# Patient Record
Sex: Female | Born: 1965 | Race: Black or African American | Hispanic: No | Marital: Married | Smoking: Never smoker
Health system: Southern US, Community
[De-identification: ages and names within clinical notes are randomized; demographics above are authoritative.]

## PROBLEM LIST (undated history)

## (undated) DIAGNOSIS — Z8049 Family history of malignant neoplasm of other genital organs: Secondary | ICD-10-CM

## (undated) DIAGNOSIS — Z5189 Encounter for other specified aftercare: Secondary | ICD-10-CM

## (undated) DIAGNOSIS — C801 Malignant (primary) neoplasm, unspecified: Secondary | ICD-10-CM

## (undated) DIAGNOSIS — Z803 Family history of malignant neoplasm of breast: Secondary | ICD-10-CM

## (undated) DIAGNOSIS — E079 Disorder of thyroid, unspecified: Secondary | ICD-10-CM

## (undated) HISTORY — PX: ABDOMINAL HYSTERECTOMY: SHX81

## (undated) HISTORY — PX: THYROIDECTOMY: SHX17

## (undated) HISTORY — DX: Malignant (primary) neoplasm, unspecified: C80.1

## (undated) HISTORY — DX: Family history of malignant neoplasm of breast: Z80.3

## (undated) HISTORY — DX: Family history of malignant neoplasm of other genital organs: Z80.49

## (undated) HISTORY — PX: MYOMECTOMY: SHX85

## (undated) HISTORY — PX: SHOULDER SURGERY: SHX246

## (undated) HISTORY — DX: Encounter for other specified aftercare: Z51.89

---

## 2000-04-25 DIAGNOSIS — C801 Malignant (primary) neoplasm, unspecified: Secondary | ICD-10-CM

## 2000-04-25 HISTORY — DX: Malignant (primary) neoplasm, unspecified: C80.1

## 2013-03-14 DIAGNOSIS — K625 Hemorrhage of anus and rectum: Secondary | ICD-10-CM | POA: Insufficient documentation

## 2013-03-28 DIAGNOSIS — Z803 Family history of malignant neoplasm of breast: Secondary | ICD-10-CM | POA: Insufficient documentation

## 2013-03-28 DIAGNOSIS — E039 Hypothyroidism, unspecified: Secondary | ICD-10-CM | POA: Insufficient documentation

## 2013-03-28 DIAGNOSIS — E669 Obesity, unspecified: Secondary | ICD-10-CM | POA: Insufficient documentation

## 2013-03-28 DIAGNOSIS — Z85850 Personal history of malignant neoplasm of thyroid: Secondary | ICD-10-CM | POA: Insufficient documentation

## 2013-03-28 DIAGNOSIS — Z833 Family history of diabetes mellitus: Secondary | ICD-10-CM | POA: Insufficient documentation

## 2013-03-28 DIAGNOSIS — Z90710 Acquired absence of both cervix and uterus: Secondary | ICD-10-CM | POA: Insufficient documentation

## 2013-04-10 DIAGNOSIS — N83209 Unspecified ovarian cyst, unspecified side: Secondary | ICD-10-CM | POA: Insufficient documentation

## 2013-04-10 DIAGNOSIS — R32 Unspecified urinary incontinence: Secondary | ICD-10-CM | POA: Insufficient documentation

## 2013-04-10 DIAGNOSIS — E890 Postprocedural hypothyroidism: Secondary | ICD-10-CM | POA: Insufficient documentation

## 2014-07-12 DIAGNOSIS — T783XXA Angioneurotic edema, initial encounter: Secondary | ICD-10-CM | POA: Insufficient documentation

## 2015-05-18 DIAGNOSIS — C73 Malignant neoplasm of thyroid gland: Secondary | ICD-10-CM | POA: Insufficient documentation

## 2016-08-04 DIAGNOSIS — N951 Menopausal and female climacteric states: Secondary | ICD-10-CM | POA: Insufficient documentation

## 2016-11-28 ENCOUNTER — Ambulatory Visit (HOSPITAL_COMMUNITY)
Admission: EM | Admit: 2016-11-28 | Discharge: 2016-11-28 | Disposition: A | Discharge status: Home / Self Care | Payer: PRIVATE HEALTH INSURANCE | Attending: Internal Medicine | Admitting: Internal Medicine

## 2016-11-28 ENCOUNTER — Encounter (HOSPITAL_COMMUNITY): Payer: Self-pay | Admitting: Nurse Practitioner

## 2016-11-28 DIAGNOSIS — L723 Sebaceous cyst: Secondary | ICD-10-CM | POA: Diagnosis not present

## 2016-11-28 HISTORY — DX: Disorder of thyroid, unspecified: E07.9

## 2016-11-28 MED ORDER — SULFAMETHOXAZOLE-TRIMETHOPRIM 800-160 MG PO TABS: 1.0000 | ORAL_TABLET | Freq: Two times a day (BID) | ORAL | 0 refills | Status: AC

## 2016-11-28 NOTE — Discharge Instructions (Addendum)
Wash gently with soap/water 1-2 times daily, apply antibiotic ointment and bandage.  Recheck at urgent care in about 48 hours and if any increasing redness/swelling/pain/drainage or new fever>100.5.  Prescription for trimethoprim/sulfa (antibiotic) sent to the pharmacy.  Ibuprofen or aleve otc as needed for pain.

## 2016-11-28 NOTE — ED Provider Notes (Signed)
[place]    CSN: [id number] Arrival date & time: 11/28/16  1734     History   Chief Complaint Chief Complaint  Patient presents with  . Breast Pain    HPI Anne Krause is a 51 y.o. female. She has a long-standing cyst on the inferior surface of her right breast, this was evaluated in the last year at [facility] and thought to be consistent with sebaceous cyst. She believes that long ago this required incision and drainage. In the last couple days, it has become red and very sore, swollen.  Not draining. No fever. 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  HPI  Past Medical History:  Diagnosis Date  . Thyroid disease     Past Surgical History:  Procedure Laterality Date  . ABDOMINAL HYSTERECTOMY    . MYOMECTOMY    . THYROIDECTOMY      Home Medications    Prior to Admission medications   Medication Sig Start Date End Date Taking? Authorizing Provider  levothyroxine (SYNTHROID, LEVOTHROID) 125 MCG tablet Take 125 mcg by mouth daily before breakfast.   Yes [provider]  sulfamethoxazole-trimethoprim (BACTRIM DS,SEPTRA DS) 800-160 MG tablet Take 1 tablet by mouth 2 (two) times daily. 11/28/16 12/05/16  Sherlene Shams, MD    Family History No family history on file.  Social History Social History  Substance Use Topics  . Smoking status: Never Smoker  . Smokeless tobacco: Never Used  . Alcohol use No     Allergies   Patient has no known allergies.   Review of Systems Review of Systems  All other systems reviewed and are negative.    Physical Exam Triage Vital Signs ED Triage Vitals  Enc Vitals Group     BP 11/28/16 1835 137/85     Pulse Rate 11/28/16 1835 80     Resp 11/28/16 1835 17     Temp 11/28/16 1835 97.8 F (36.6 C)     Temp Source 11/28/16 1835 Oral     SpO2 11/28/16 1835 100 %     Weight --      Height --      Pain Score 11/28/16 1839 8     Pain Loc --    Updated Vital Signs BP 137/85    Pulse 80   Temp 97.8 F (36.6 C) (Oral)   Resp 17   SpO2 100%   Physical Exam  Constitutional: She is oriented to person, place, and time. No distress.  HENT:  Head: Atraumatic.  Eyes:  Conjugate gaze observed, no eye redness/discharge  Neck: Neck supple.  Cardiovascular: Normal rate.   Pulmonary/Chest: No respiratory distress.  Abdominal: She exhibits no distension.  Musculoskeletal: Normal range of motion.  Neurological: She is alert and oriented to person, place, and time.  Skin: Skin is warm and dry.  Inferior surface of the right breast there is a 4 inch indurated/red area, with a 1.25 inch central cystic area, fluctuant. Not pointing.  Nursing note and vitals reviewed.    UC Treatments / Results   Procedures Procedures (including critical care time) Top of the lesion was cleaned with Hibiclens, and infiltrated with 2% lidocaine. A 1.5 cm incision was made and copious purulent material was expressed from the lesion. A dressing was applied per clinical staff.  Final Clinical Impressions(s) / UC Diagnoses   Final diagnoses:  Inflamed sebaceous cyst   Wash gently with soap/water 1-2 times daily, apply antibiotic ointment and bandage.  Recheck at urgent care in about 48 hours and if any increasing redness/swelling/pain/drainage or new fever>100.5.  Prescription for trimethoprim/sulfa (antibiotic) sent to the pharmacy.  Ibuprofen or aleve otc as needed for pain.  New Prescriptions Discharge Medication List as of 11/28/2016  7:30 PM    START taking these medications   Details  sulfamethoxazole-trimethoprim (BACTRIM DS,SEPTRA DS) 800-160 MG tablet Take 1 tablet by mouth 2 (two) times daily., Starting Mon 11/28/2016, Until Mon 12/05/2016, Normal         Controlled Substance Prescriptions [place] Controlled Substance Registry consulted? Not Applicable   Sherlene Shams, MD 12/02/16 [phone]

## 2016-11-28 NOTE — ED Triage Notes (Signed)
Pt presents with c/o painful cyst to right breast. She was first seen by a healthcare provider for this cyst this past December and had testing that found the cyst was benign. The cyst has remained since but this week has become red and painful.

## 2016-11-30 ENCOUNTER — Ambulatory Visit (HOSPITAL_COMMUNITY)
Admission: EM | Admit: 2016-11-30 | Discharge: 2016-11-30 | Disposition: A | Discharge status: Home / Self Care | Payer: PRIVATE HEALTH INSURANCE | Attending: Internal Medicine | Admitting: Internal Medicine

## 2016-11-30 DIAGNOSIS — N6001 Solitary cyst of right breast: Secondary | ICD-10-CM

## 2016-11-30 DIAGNOSIS — R110 Nausea: Secondary | ICD-10-CM

## 2016-11-30 MED ORDER — ONDANSETRON 8 MG PO TBDP: 8.0000 mg | ORAL_TABLET | Freq: Three times a day (TID) | ORAL | 0 refills | Status: DC | PRN

## 2016-11-30 NOTE — Discharge Instructions (Signed)
You may return to work tomorrow.  Finish the antibiotics and take the Zofran prn nausea.

## 2016-11-30 NOTE — ED Provider Notes (Signed)
[place]    CSN: [id number] Arrival date & time: 11/30/16  1706     History   Chief Complaint No chief complaint on file.   HPI Anne Krause is a 51 y.o. female.   This is a follow up visit for an incision and drainage of the right lateral, inferior breast done two days ago.  The drainage is now minimal and the tenderness has significantly diminished.  Patient has had some nausea with the antibiotics.      Past Medical History:  Diagnosis Date  . Thyroid disease     There are no active problems to display for this patient.   Past Surgical History:  Procedure Laterality Date  . ABDOMINAL HYSTERECTOMY    . MYOMECTOMY    . THYROIDECTOMY      OB History    No data available       Home Medications    Prior to Admission medications   Medication Sig Start Date End Date Taking? Authorizing Provider  levothyroxine (SYNTHROID, LEVOTHROID) 125 MCG tablet Take 125 mcg by mouth daily before breakfast.    [provider]  ondansetron (ZOFRAN-ODT) 8 MG disintegrating tablet Take 1 tablet (8 mg total) by mouth every 8 (eight) hours as needed for nausea. 11/30/16   Robyn Haber, MD  sulfamethoxazole-trimethoprim (BACTRIM DS,SEPTRA DS) 800-160 MG tablet Take 1 tablet by mouth 2 (two) times daily. 11/28/16 12/05/16  Sherlene Shams, MD    Family History No family history on file.  Social History Social History  Substance Use Topics  . Smoking status: Never Smoker  . Smokeless tobacco: Never Used  . Alcohol use No     Allergies   Patient has no known allergies.   Review of Systems Review of Systems  Constitutional: Negative for fever.  Gastrointestinal: Positive for nausea. Negative for vomiting.  Skin: Positive for wound.  All other systems reviewed and are negative.    Physical Exam Triage Vital Signs ED Triage Vitals  Enc Vitals Group     BP      Pulse      Resp      Temp      Temp src      SpO2      Weight      Height        Head Circumference      Peak Flow      Pain Score      Pain Loc      Pain Edu?      Excl. in [place]?    No data found.   Updated Vital Signs There were no vitals taken for this visit.    Physical Exam  Constitutional: She is oriented to person, place, and time. She appears well-developed and well-nourished.  HENT:  Right Ear: External ear normal.  Left Ear: External ear normal.  Mouth/Throat: Oropharynx is clear and moist.  Eyes: Pupils are equal, round, and reactive to light. Conjunctivae and EOM are normal.  Neck: Normal range of motion. Neck supple.  Pulmonary/Chest: Effort normal.  Musculoskeletal: Normal range of motion.  Neurological: She is alert and oriented to person, place, and time.  Skin:  Incision right breast shows minimal serosanguinous drainage and early keloid formation.  Nursing note and vitals reviewed.    UC Treatments / Results  Labs (all labs ordered are listed, but only abnormal results are displayed) Labs Reviewed - No data to display  EKG  EKG Interpretation None  Radiology No results found.  Procedures Procedures (including critical care time)  Medications Ordered in UC Medications - No data to display   Initial Impression / Assessment and Plan / UC Course  I have reviewed the triage vital signs and the nursing notes.  Pertinent labs & imaging results that were available during my care of the patient were reviewed by me and considered in my medical decision making (see chart for details).      Final Clinical Impressions(s) / UC Diagnoses   Final diagnoses:  Breast cyst, right  Nausea without vomiting    New Prescriptions New Prescriptions   ONDANSETRON (ZOFRAN-ODT) 8 MG DISINTEGRATING TABLET    Take 1 tablet (8 mg total) by mouth every 8 (eight) hours as needed for nausea.     Controlled Substance Prescriptions [place] Controlled Substance Registry consulted? Not Applicable   Robyn Haber, MD 11/30/16  [phone]

## 2016-11-30 NOTE — ED Notes (Signed)
See downtime documentation 

## 2017-03-17 ENCOUNTER — Telehealth: Payer: Self-pay | Admitting: Genetics

## 2017-03-17 NOTE — Telephone Encounter (Signed)
Patient called and moved appt from 11/27 to 1/7. Patient has new date/time

## 2017-03-21 ENCOUNTER — Encounter: Payer: PRIVATE HEALTH INSURANCE | Admitting: Genetics

## 2017-03-21 ENCOUNTER — Other Ambulatory Visit: Payer: PRIVATE HEALTH INSURANCE

## 2017-05-01 ENCOUNTER — Inpatient Hospital Stay: Payer: PRIVATE HEALTH INSURANCE | Attending: Genetic Counselor | Admitting: Genetics

## 2017-05-01 DIAGNOSIS — Z8049 Family history of malignant neoplasm of other genital organs: Secondary | ICD-10-CM | POA: Diagnosis not present

## 2017-05-01 DIAGNOSIS — Z803 Family history of malignant neoplasm of breast: Secondary | ICD-10-CM

## 2017-05-02 ENCOUNTER — Encounter: Payer: Self-pay | Admitting: Genetics

## 2017-05-02 DIAGNOSIS — Z8049 Family history of malignant neoplasm of other genital organs: Secondary | ICD-10-CM | POA: Insufficient documentation

## 2017-05-02 NOTE — Progress Notes (Signed)
REFERRING PROVIDER: Tyson Dense, MD [address] [place], [place] [address]  PRIMARY PROVIDER:  System, Pcp Not In  PRIMARY REASON FOR VISIT:  1. Family history of breast cancer   2. Family history of uterine cancer     HISTORY OF PRESENT ILLNESS:   Anne Krause, a 52 y.o. female, was seen for a [place] cancer genetics consultation at the request of Dr. Royston Sinner due to a personal and family history of cancer.  Anne Krause presents to clinic today to discuss the possibility of a hereditary predisposition to cancer, genetic testing, and to further clarify her future cancer risks, as well as potential cancer risks for family members.   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     HORMONAL RISK FACTORS:  Menarche was at age 20.5.  First live birth at age N/A.  Ovaries intact: 1 intact, the other removed.  Hysterectomy: yes.  Menopausal status: perimenopause.  HRT use: 0 years. Considering stariting it.  Colonoscopy: no; not examined. Mammogram within the last year: 2018- a cyst. Both breasts heterogeneously dense Number of breast biopsies: 0.  Past Medical History:  Diagnosis Krause  . Cancer [facility] 2002   thyroid  . Family history of breast cancer   . Family history of uterine cancer   . Thyroid disease      Social History   Socioeconomic History  . Marital status: Married    Spouse name: None  . Number of children: None  . Years of education: None  . Highest education level: None  Social Needs  . Financial resource strain: None  . Food insecurity - worry: None  . Food insecurity - inability: None  . Transportation needs - medical: None  . Transportation needs - non-medical: None  Occupational History  . None  Tobacco Use  . Smoking status: Never Smoker  . Smokeless tobacco: Never Used  Substance and Sexual Activity  . Alcohol use: No  . Drug use: No  . Sexual activity: None  Other Topics  Concern  . None  Social History Narrative  . None     FAMILY HISTORY:  We obtained a detailed, 4-generation family history.  Significant diagnoses are listed below: Family History  Problem Relation Age of Onset  . Breast cancer Mother 37       radiation, tamoxifen for 2 years  . Uterine cancer Mother 42  . Stroke Maternal Grandmother    Anne Krause has no children.  She has 2 brothers in their 87's with no history of cancer.  Both of her brothers have children, none with any history of cancer.  Anne Krause also has 3 paternal half-sisters and 1 paternal half-brother in their 17's and 1's with no history of cancer.   Anne Krause mother was diagnosed with breast cancer at 38 and had lumpectomy, radiation,and took tamoxifen for 2 years.  She later developed uterine cancer at age 55.  She is currently 65.  Anne Krause has 4 maternal aunts in their 21's and 57's with no history of cancer.  She has 3 maternal uncles deceased in their 4's/60's with no history of cancer, and a maternal uncle still living in his 73's with no history of cancer.  Anne Krause has several maternal cousins with no history of cancer.  Anne Krause maternal grandfather died of 'natural causes' at older age and her maternal grandmother died at 66 due to a stroke.  [name] father died at 25 with no history of cancer. Her father had no siblings.  Anne Krause maternal grandfather died in his 60's/60's with no known history of cancer, and her paternal grandmother died due to 'natural causes' in older age.      Anne Krause is unaware of previous family history of genetic testing for hereditary cancer risks. Patient's maternal ancestors are of [place] descent, and paternal ancestors are of [place] descent. There is no reported Ashkenazi Jewish ancestry. There is no known consanguinity.  GENETIC COUNSELING ASSESSMENT:  We, discussed and recommended the following at today's visit.   DISCUSSION: We reviewed the  characteristics, features and inheritance patterns of hereditary cancer syndromes. We also discussed genetic testing, including the appropriate family members to test, the process of testing, insurance coverage and turn-around-time for results. We discussed the implications of a negative, positive and/or variant of uncertain significant result. We offered Anne Krause pursue genetic testing for the Common Hereditary Cancer Panel + Thyroid Cancer panel.  APC, ATM, AXIN2, BARD1, BMPR1A, BRCA1, BRCA2, BRIP1, CDH1, CDK4, CDKN2A (p14ARF), CDKN2A (p16INK4a), CHEK2, CTNNA1, DICER1, EPCAM, GREM1, KIT, MEN1, MLH1, MSH2, MSH3, MSH6, MUTYH, NBN, NF1, NHTL1, PALB2, PDGFRA, PMS2, POLD1, POLE, PTEN, RAD50, RAD51C, RAD51D, SDHB, SDHC, SDHD, SMAD4, SMARCA4. STK11, TP53, TSC1, TSC2, VHL, SDHA, HOXB13   The [facility] Thyroid Cancer Panel analyzes the following genes: APC, CHEK2, DICER1,PRKAR1A, PTEN, RET, TP53, MEN1, SDHB, SDHD, and WRN.   We discussed that only 5-10% of cancers are associated with a Hereditary cancer predisposition syndrome.  One of the most common hereditary cancer syndromes that increases breast cancer risk is called Hereditary Breast and Ovarian Cancer (HBOC) syndrome.  This syndrome is caused by mutations in the BRCA1 and BRCA2 genes.  This syndrome increases an individual's lifetime risk to develop breast, ovarian, pancreatic, and other types of cancer.  There are also many other cancer predisposition syndromes caused by mutations in several other genes.  We briefly discussed that CHEK2 is a moderate risk breast cancer gene that may also be associated to thyroid cancer.    We discussed that if she is found to have a mutation in one of these genes, it may impact future medical management recommendations such as increased cancer screenings and consideration of risk reducing surgeries.  A positive result could also have implications for the patient's family members.  A Negative result would mean we were unable  to identify a hereditary component to her cancer, but does not rule out the possibility of a hereditary basis for her and her family's cancer.  There could be mutations that are undetectable by current technology, or in genes not yet tested or identified to increase cancer risk.    We discussed the potential to find a Variant of Uncertain Significance or VUS.  These are variants that have not yet been identified as pathogenic or benign, and it is unknown if this variant is associated with increased cancer risk or if this is a normal finding.  Most VUS's are reclassified to benign or likely benign.   It should not be used to make medical management decisions. With time, we suspect the lab will determine the significance of any VUS's identified if any.   We discussed that some people do not want to undergo genetic testing due to fear of genetic discrimination.  A federal law called the Genetic Information Non-Discrimination Act (GINA) of 2008 helps protect individuals against genetic discrimination based on their genetic test results.  It impacts both health  insurance and employment.  For health insurance, it protects against increased premiums, being kicked off insurance or being forced to take a test in order to be insured.  For employment it protects against hiring, firing and promoting decisions based on genetic test results.  Health status due to a cancer diagnosis is not protected under GINA.  This law does not protect life insurance, disability insurance, or other types of insurance.   We discussed with Anne Krause that the family history is not highly consistent with a familial hereditary cancer syndrome, and we feel she is at low risk to harbor a gene mutation associated with such a condition. Thus, we did not recommend any genetic testing, at this time, and recommended Anne Krause continue to follow the cancer screening guidelines given by her primary healthcare provider.  However, we did offer her the  opportunity to have genetic testing if she still chooses (she expressed it may be helpful for deciding to use HRT or not).  We discussed the patient pay price for testing is $250 through the lab [facility].   Based on the patient's personal and family history, the statistical model (Tyrer Cusik)   Was used to estimate her risk of developing breast cancer. This estimates her lifetime risk of developing breast cancer to be approximately 22.1%. This estimation does not take into account any genetic testing results.  The patient's lifetime breast cancer risk is a preliminary estimate based on available information using one of several models endorsed by the [place] (ACS). The ACS recommends consideration of breast MRI screening as an adjunct to mammography for patients at high risk (defined as 20% or greater lifetime risk). A more detailed breast cancer risk assessment can be considered, if clinically indicated.   Anne Krause has been determined to be at high risk for breast cancer.  Therefore, we recommend that annual screening with mammography and breast MRI begin at age 69, or 10 years prior to the age of breast cancer diagnosis in a relative (whichever is earlier).  We discussed that Anne Krause should discuss her individual situation with her referring physician and determine a breast cancer screening plan with which they are both comfortable.       PLAN: Anne Krause decided not to pursue genetic testing today.  She will review information and think about the option to test and will contact us if she is interested in pursuing genetic testing in the future.   We encouraged Ms. Delano to remain in contact with cancer genetics annually so that we can continuously update the family history and inform her of any changes in cancer genetics and testing that may be of benefit for her family. Ms. Earll questions were answered to her satisfaction today. Our contact information was provided should additional  questions or concerns arise.  Lastly, we encouraged Ms. Riche to remain in contact with cancer genetics annually so that we can continuously update the family history and inform her of any changes in cancer genetics and testing that may be of benefit for this family.   Ms.  Lovins questions were answered to her satisfaction today. Our contact information was provided should additional questions or concerns arise. Thank you for the referral and allowing [place] to share in the care of your patient.   Tana Felts, MS Genetic Counselor [email or web address] phone: [phone]  The patient was seen for a total of 30 minutes in face-to-face genetic counseling.

## 2017-05-15 ENCOUNTER — Encounter: Payer: Self-pay | Admitting: Genetics

## 2017-12-01 ENCOUNTER — Encounter: Payer: Self-pay | Admitting: Gastroenterology

## 2017-12-07 ENCOUNTER — Ambulatory Visit: Payer: Self-pay | Admitting: Nurse Practitioner

## 2017-12-07 VITALS — BP 122/70 | HR 70 | Temp 98.8°F | Resp 18 | Wt 208.0 lb

## 2017-12-07 DIAGNOSIS — J0140 Acute pansinusitis, unspecified: Secondary | ICD-10-CM

## 2017-12-07 MED ORDER — AMOXICILLIN-POT CLAVULANATE 875-125 MG PO TABS: 1.0000 | ORAL_TABLET | Freq: Two times a day (BID) | ORAL | 0 refills | Status: AC

## 2017-12-07 NOTE — Patient Instructions (Signed)
Sinusitis, Adult -Take Augmentin as prescribed.  Take with food and water. -Use saline nasal spray for congestion. -Ibuprofen or Tylenol for pain, fever or general discomfort. -Increase fluids. -Use humidifier or vaporizer while at home and during sleeping hours. -Follow up as needed.   Sinusitis is soreness and inflammation of your sinuses. Sinuses are hollow spaces in the bones around your face. Your sinuses are located:  Around your eyes.  In the middle of your forehead.  Behind your nose.  In your cheekbones.  Your sinuses and nasal passages are lined with a stringy fluid (mucus). Mucus normally drains out of your sinuses. When your nasal tissues become inflamed or swollen, the mucus can become trapped or blocked so air cannot flow through your sinuses. This allows bacteria, viruses, and funguses to grow, which leads to infection. Sinusitis can develop quickly and last for 7?10 days (acute) or for more than 12 weeks (chronic). Sinusitis often develops after a cold. What are the causes? This condition is caused by anything that creates swelling in the sinuses or stops mucus from draining, including:  Allergies.  Asthma.  Bacterial or viral infection.  Abnormally shaped bones between the nasal passages.  Nasal growths that contain mucus (nasal polyps).  Narrow sinus openings.  Pollutants, such as chemicals or irritants in the air.  A foreign object stuck in the nose.  A fungal infection. This is rare.  What increases the risk? The following factors may make you more likely to develop this condition:  Having allergies or asthma.  Having had a recent cold or respiratory tract infection.  Having structural deformities or blockages in your nose or sinuses.  Having a weak immune system.  Doing a lot of swimming or diving.  Overusing nasal sprays.  Smoking.  What are the signs or symptoms? The main symptoms of this condition are pain and a feeling of pressure  around the affected sinuses. Other symptoms include:  Upper toothache.  Earache.  Headache.  Bad breath.  Decreased sense of smell and taste.  A cough that may get worse at night.  Fatigue.  Fever.  Thick drainage from your nose. The drainage is often green and it may contain pus (purulent).  Stuffy nose or congestion.  Postnasal drip. This is when extra mucus collects in the throat or back of the nose.  Swelling and warmth over the affected sinuses.  Sore throat.  Sensitivity to light.  How is this diagnosed? This condition is diagnosed based on symptoms, a medical history, and a physical exam. To find out if your condition is acute or chronic, your health care provider may:  Look in your nose for signs of nasal polyps.  Tap over the affected sinus to check for signs of infection.  View the inside of your sinuses using an imaging device that has a light attached (endoscope).  If your health care provider suspects that you have chronic sinusitis, you may also:  Be tested for allergies.  Have a sample of mucus taken from your nose (nasal culture) and checked for bacteria.  Have a mucus sample examined to see if your sinusitis is related to an allergy.  If your sinusitis does not respond to treatment and it lasts longer than 8 weeks, you may have an MRI or CT scan to check your sinuses. These scans also help to determine how severe your infection is. In rare cases, a bone biopsy may be done to rule out more serious types of fungal sinus disease. How is  this treated? Treatment for sinusitis depends on the cause and whether your condition is chronic or acute. If a virus is causing your sinusitis, your symptoms will go away on their own within 10 days. You may be given medicines to relieve your symptoms, including:  Topical nasal decongestants. They shrink swollen nasal passages and let mucus drain from your sinuses.  Antihistamines. These drugs block inflammation  that is triggered by allergies. This can help to ease swelling in your nose and sinuses.  Topical nasal corticosteroids. These are nasal sprays that ease inflammation and swelling in your nose and sinuses.  Nasal saline washes. These rinses can help to get rid of thick mucus in your nose.  If your condition is caused by bacteria, you will be given an antibiotic medicine. If your condition is caused by a fungus, you will be given an antifungal medicine. Surgery may be needed to correct underlying conditions, such as narrow nasal passages. Surgery may also be needed to remove polyps. Follow these instructions at home: Medicines  Take, use, or apply over-the-counter and prescription medicines only as told by your health care provider. These may include nasal sprays.  If you were prescribed an antibiotic medicine, take it as told by your health care provider. Do not stop taking the antibiotic even if you start to feel better. Hydrate and Humidify  Drink enough water to keep your urine clear or pale yellow. Staying hydrated will help to thin your mucus.  Use a cool mist humidifier to keep the humidity level in your home above 50%.  Inhale steam for 10-15 minutes, 3-4 times a day or as told by your health care provider. You can do this in the bathroom while a hot shower is running.  Limit your exposure to cool or dry air. Rest  Rest as much as possible.  Sleep with your head raised (elevated).  Make sure to get enough sleep each night. General instructions  Apply a warm, moist washcloth to your face 3-4 times a day or as told by your health care provider. This will help with discomfort.  Wash your hands often with soap and water to reduce your exposure to viruses and other germs. If soap and water are not available, use hand sanitizer.  Do not smoke. Avoid being around people who are smoking (secondhand smoke).  Keep all follow-up visits as told by your health care provider. This is  important. Contact a health care provider if:  You have a fever.  Your symptoms get worse.  Your symptoms do not improve within 10 days. Get help right away if:  You have a severe headache.  You have persistent vomiting.  You have pain or swelling around your face or eyes.  You have vision problems.  You develop confusion.  Your neck is stiff.  You have trouble breathing. This information is not intended to replace advice given to you by your health care provider. Make sure you discuss any questions you have with your health care provider. Document Released: 04/11/2005 Document Revised: 12/06/2015 Document Reviewed: 02/04/2015 Elsevier Interactive Patient Education  [employer].

## 2017-12-07 NOTE — Progress Notes (Signed)
Subjective:  Anne Krause is a 52 y.o. female who presents for evaluation of possible sinusitis.  Symptoms include bilateral ear pressure/pain, chills, headache described as throbbing and aching, nasal congestion, post nasal drip, sinus pressure, sinus pain, sore throat and swollen glands.  Patient also states that she has been having some bloody mucus when she blows her nose.  Onset of symptoms was 7 days ago, and has been gradually worsening since that time.  Treatment to date:  Mucinex, Pseudophed and Robitussin.  High risk factors for influenza complications:  none.  The following portions of the patient's history were reviewed and updated as appropriate:  allergies, current medications and past medical history.  Constitutional: positive for anorexia, chills and fatigue, negative for malaise, sweats and weight loss Eyes: negative Ears, nose, mouth, throat, and face: positive for nasal congestion, sore throat and See HPI, negative for ear drainage and earaches Respiratory: positive for cough, negative for asthma, chronic bronchitis, pleurisy/chest pain, sputum, stridor and wheezing Cardiovascular: negative Gastrointestinal: negative Neurological: positive for headaches, negative for coordination problems, dizziness, paresthesia, speech problems, tremors, vertigo and weakness Allergic/Immunologic: negative Objective:  BP 122/70 (BP Location: Right Arm, Patient Position: Sitting, Cuff Size: Normal)   Pulse 70   Temp 98.8 F (37.1 C) (Oral)   Resp 18   Wt 208 lb (94.3 kg)   SpO2 98%  General appearance: alert, cooperative, fatigued and no distress Head: Normocephalic, without obvious abnormality, atraumatic Eyes: conjunctivae/corneas clear. PERRL, EOM's intact. Fundi benign. Ears: abnormal TM right ear - mucoid middle ear fluid Nose: no discharge, turbinates swollen, inflamed, moderate maxillary sinus tenderness bilateral, moderate frontal sinus tenderness bilateral Throat: lips, mucosa,  and tongue normal; teeth and gums normal Lungs: clear to auscultation bilaterally Heart: regular rate and rhythm, S1, S2 normal, no murmur, click, rub or gallop Abdomen: soft, non-tender; bowel sounds normal; no masses,  no organomegaly Pulses: 2+ and symmetric Skin: Skin color, texture, turgor normal. No rashes or lesions Lymph nodes: cervical and submandibular nodes normal Neurologic: Grossly normal    Assessment:  Acute Pansinusitis    Plan:  Exam findings, diagnosis etiology and medication use and indications reviewed with patient. Follow- Up and discharge instructions provided. No emergent/urgent issues found on exam.  The patient was instructed to use saline nasal spray to avoid any further breakdown of her nasal tissue.Patient verbalized understanding of information provided and agrees with plan of care (POC), all questions answered.   1. Acute pansinusitis, recurrence not specified  - amoxicillin-clavulanate (AUGMENTIN) 875-125 MG tablet; Take 1 tablet by mouth 2 (two) times daily for 10 days.  Dispense: 20 tablet; Refill: 0 -Take Augmentin as prescribed.  Take with food and water. -Use saline nasal spray for congestion. -Ibuprofen or Tylenol for pain, fever or general discomfort. -Increase fluids. -Use humidifier or vaporizer while at home and during sleeping hours. -Follow up as needed.

## 2018-01-26 ENCOUNTER — Ambulatory Visit (AMBULATORY_SURGERY_CENTER): Payer: Self-pay | Admitting: *Deleted

## 2018-01-26 ENCOUNTER — Encounter: Payer: Self-pay | Admitting: Gastroenterology

## 2018-01-26 VITALS — Ht 65.0 in | Wt 215.0 lb

## 2018-01-26 DIAGNOSIS — Z1211 Encounter for screening for malignant neoplasm of colon: Secondary | ICD-10-CM

## 2018-01-26 NOTE — Progress Notes (Signed)
Patient denies any allergies to eggs or soy. Patient denies any problems with anesthesia/sedation. Patient denies any oxygen use at home. Patient denies taking any diet/weight loss medications or blood thinners. EMMI education assisgned to patient on colonoscopy, this was explained and instructions given to patient. 

## 2018-02-09 ENCOUNTER — Ambulatory Visit (AMBULATORY_SURGERY_CENTER): Payer: BLUE CROSS/BLUE SHIELD | Admitting: Gastroenterology

## 2018-02-09 ENCOUNTER — Encounter: Payer: Self-pay | Admitting: Gastroenterology

## 2018-02-09 VITALS — BP 106/74 | HR 65 | Temp 96.8°F | Resp 23 | Ht 65.0 in | Wt 208.0 lb

## 2018-02-09 DIAGNOSIS — D122 Benign neoplasm of ascending colon: Secondary | ICD-10-CM

## 2018-02-09 DIAGNOSIS — Z1211 Encounter for screening for malignant neoplasm of colon: Secondary | ICD-10-CM

## 2018-02-09 MED ORDER — SODIUM CHLORIDE 0.9 % IV SOLN
500.0000 mL | Freq: Once | INTRAVENOUS | Status: DC
Start: 2018-02-09 — End: 2018-02-09

## 2018-02-09 NOTE — Op Note (Signed)
[place] Patient Name: Anne Krause Procedure Date: 02/09/2018 9:19 AM MRN: [id number] Endoscopist: Thornton Park MD, MD Age: 52 Referring MD:  Date of Birth: 08-15-1965 Gender: Female Account #: [id number] Procedure:                Colonoscopy Indications:              Screening for colorectal malignant neoplasm. There                            is no known family history of colon cancer or                            polyps. No baseline GI symptoms. Medicines:                See the Anesthesia note for documentation of the                            administered medications Procedure:                Pre-Anesthesia Assessment:                           - Prior to the procedure, a History and Physical                            was performed, and patient medications and                            allergies were reviewed. The patient's tolerance of                            previous anesthesia was also reviewed. The risks                            and benefits of the procedure and the sedation                            options and risks were discussed with the patient.                            All questions were answered, and informed consent                            was obtained. Prior Anticoagulants: The patient has                            taken no previous anticoagulant or antiplatelet                            agents. ASA Grade Assessment: II - A patient with                            mild systemic disease. After reviewing the risks  and benefits, the patient was deemed in                            satisfactory condition to undergo the procedure.                           After obtaining informed consent, the colonoscope                            was passed under direct vision. Throughout the                            procedure, the patient's blood pressure, pulse, and                            oxygen saturations were  monitored continuously. The                            Colonoscope was introduced through the anus and                            advanced to the the terminal ileum, with                            identification of the appendiceal orifice and IC                            valve. The colonoscopy was performed without                            difficulty. The patient tolerated the procedure                            well. The quality of the bowel preparation was good. Scope In: 9:20:04 AM Scope Out: 9:33:25 AM Scope Withdrawal Time: 0 hours 10 minutes 34 seconds  Total Procedure Duration: 0 hours 13 minutes 21 seconds  Findings:                 The perianal and digital rectal examinations were                            normal. [Pertinent Negatives].                           A 3 mm polyp was found in the proximal ascending                            colon. The polyp was sessile. The polyp was removed                            with a cold snare. Resection and retrieval were                            complete. Estimated blood loss was minimal.  A 2 mm polyp was found in the distal ascending                            colon. The polyp was sessile. The polyp was removed                            with a cold biopsy forceps. Resection and retrieval                            were complete. Estimated blood loss was minimal.                           The exam was otherwise without abnormality on                            direct and retroflexion views. Complications:            No immediate complications. Estimated blood loss:                            Minimal. Estimated Blood Loss:     Estimated blood loss was minimal. Impression:               - One 3 mm polyp in the proximal ascending colon,                            removed with a cold snare. Resected and retrieved.                           - One 2 mm polyp in the distal ascending colon,                             removed with a cold biopsy forceps. Resected and                            retrieved.                           - The examination was otherwise normal on direct                            and retroflexion views. Recommendation:           - Discharge patient to home.                           - Resume previous diet today.                           - Continue present medications.                           - Await pathology results.                           - Repeat colonoscopy in 5 years  for surveillance if                            at least one polyp is adenomatous. Thornton Park MD, MD 02/09/2018 9:38:55 AM This report has been signed electronically.

## 2018-02-09 NOTE — Progress Notes (Signed)
Report given to PACU, vss 

## 2018-02-09 NOTE — Progress Notes (Signed)
Recovery and d/c instructions given and done by e watkins Rn

## 2018-02-09 NOTE — Progress Notes (Signed)
Called to room to assist during endoscopic procedure.  Patient ID and intended procedure confirmed with present staff. Received instructions for my participation in the procedure from the performing physician.  

## 2018-02-09 NOTE — Progress Notes (Signed)
Pt's states no medical or surgical changes since previsit or office visit. 

## 2018-02-09 NOTE — Patient Instructions (Signed)
   Thank you for allowing [place] to care for you today!  Await pathology results by mail, will determine recommended repeat colonoscopy at that time.  Handout provided for polyps.  Resume previous diet and medications today.  Return to normal activities tomorrow.   YOU HAD AN ENDOSCOPIC PROCEDURE TODAY AT [facility]:   Refer to the procedure report that was given to you for any specific questions about what was found during the examination.  If the procedure report does not answer your questions, please call your gastroenterologist to clarify.  If you requested that your care partner not be given the details of your procedure findings, then the procedure report has been included in a sealed envelope for you to review at your convenience later.  YOU SHOULD EXPECT: Some feelings of bloating in the abdomen. Passage of more gas than usual.  Walking can help get rid of the air that was put into your GI tract during the procedure and reduce the bloating. If you had a lower endoscopy (such as a colonoscopy or flexible sigmoidoscopy) you may notice spotting of blood in your stool or on the toilet paper. If you underwent a bowel prep for your procedure, you may not have a normal bowel movement for a few days.  Please Note:  You might notice some irritation and congestion in your nose or some drainage.  This is from the oxygen used during your procedure.  There is no need for concern and it should clear up in a day or so.  SYMPTOMS TO REPORT IMMEDIATELY:   Following lower endoscopy (colonoscopy or flexible sigmoidoscopy):  Excessive amounts of blood in the stool  Significant tenderness or worsening of abdominal pains  Swelling of the abdomen that is new, acute  Fever of 100F or higher   For urgent or emergent issues, a gastroenterologist can be reached at any hour by calling [phone].   DIET:  We do recommend a small meal at first, but then you may proceed to your regular  diet.  Drink plenty of fluids but you should avoid alcoholic beverages for 24 hours.  ACTIVITY:  You should plan to take it easy for the rest of today and you should NOT DRIVE or use heavy machinery until tomorrow (because of the sedation medicines used during the test).    FOLLOW UP: Our staff will call the number listed on your records the next business day following your procedure to check on you and address any questions or concerns that you may have regarding the information given to you following your procedure. If we do not reach you, we will leave a message.  However, if you are feeling well and you are not experiencing any problems, there is no need to return our call.  We will assume that you have returned to your regular daily activities without incident.  If any biopsies were taken you will be contacted by phone or by letter within the next 1-3 weeks.  Please call us at [phone] if you have not heard about the biopsies in 3 weeks.    SIGNATURES/CONFIDENTIALITY: You and/or your care partner have signed paperwork which will be entered into your electronic medical record.  These signatures attest to the fact that that the information above on your After Visit Summary has been reviewed and is understood.  Full responsibility of the confidentiality of this discharge information lies with you and/or your care-partner.

## 2018-02-12 ENCOUNTER — Telehealth: Payer: Self-pay

## 2018-02-12 ENCOUNTER — Telehealth: Payer: Self-pay | Admitting: *Deleted

## 2018-02-12 NOTE — Telephone Encounter (Signed)
Second post procedure follow up call, left message 

## 2018-02-12 NOTE — Telephone Encounter (Signed)
Entered in error. SM 

## 2018-02-12 NOTE — Telephone Encounter (Signed)
No answer for post procedure call back. Left message and will attempt to call back later this afternoon. Sm 

## 2018-02-12 NOTE — Telephone Encounter (Signed)
Message left, follow up for procedure.

## 2018-02-13 ENCOUNTER — Encounter: Payer: Self-pay | Admitting: Gastroenterology

## 2018-04-13 ENCOUNTER — Other Ambulatory Visit: Payer: Self-pay | Admitting: Obstetrics and Gynecology

## 2018-04-13 DIAGNOSIS — N611 Abscess of the breast and nipple: Secondary | ICD-10-CM

## 2018-04-19 ENCOUNTER — Other Ambulatory Visit: Payer: Self-pay | Admitting: Obstetrics and Gynecology

## 2018-04-19 ENCOUNTER — Ambulatory Visit
Admission: RE | Admit: 2018-04-19 | Discharge: 2018-04-19 | Disposition: A | Discharge status: Home / Self Care | Payer: BLUE CROSS/BLUE SHIELD | Source: Ambulatory Visit | Attending: Obstetrics and Gynecology | Admitting: Obstetrics and Gynecology

## 2018-04-19 DIAGNOSIS — N611 Abscess of the breast and nipple: Secondary | ICD-10-CM

## 2018-04-26 ENCOUNTER — Ambulatory Visit
Admission: RE | Admit: 2018-04-26 | Discharge: 2018-04-26 | Disposition: A | Discharge status: Home / Self Care | Payer: No Typology Code available for payment source | Source: Ambulatory Visit | Attending: Obstetrics and Gynecology | Admitting: Obstetrics and Gynecology

## 2018-04-26 ENCOUNTER — Other Ambulatory Visit: Payer: Self-pay | Admitting: Obstetrics and Gynecology

## 2018-04-26 DIAGNOSIS — N611 Abscess of the breast and nipple: Secondary | ICD-10-CM

## 2018-05-03 ENCOUNTER — Other Ambulatory Visit: Payer: Self-pay | Admitting: Obstetrics and Gynecology

## 2018-05-03 ENCOUNTER — Ambulatory Visit
Admission: RE | Admit: 2018-05-03 | Discharge: 2018-05-03 | Disposition: A | Discharge status: Home / Self Care | Payer: No Typology Code available for payment source | Source: Ambulatory Visit | Attending: Obstetrics and Gynecology | Admitting: Obstetrics and Gynecology

## 2018-05-03 DIAGNOSIS — N611 Abscess of the breast and nipple: Secondary | ICD-10-CM

## 2018-06-05 ENCOUNTER — Other Ambulatory Visit: Payer: No Typology Code available for payment source

## 2018-06-07 ENCOUNTER — Other Ambulatory Visit: Payer: No Typology Code available for payment source

## 2018-06-07 ENCOUNTER — Ambulatory Visit
Admission: RE | Admit: 2018-06-07 | Discharge: 2018-06-07 | Disposition: A | Discharge status: Home / Self Care | Payer: PRIVATE HEALTH INSURANCE | Source: Ambulatory Visit | Attending: Obstetrics and Gynecology | Admitting: Obstetrics and Gynecology

## 2018-06-07 DIAGNOSIS — N611 Abscess of the breast and nipple: Secondary | ICD-10-CM

## 2018-06-26 ENCOUNTER — Emergency Department (HOSPITAL_COMMUNITY): Payer: PRIVATE HEALTH INSURANCE

## 2018-06-26 ENCOUNTER — Emergency Department (HOSPITAL_COMMUNITY)
Admission: EM | Admit: 2018-06-26 | Discharge: 2018-06-26 | Disposition: A | Discharge status: Home / Self Care | Payer: PRIVATE HEALTH INSURANCE | Attending: Emergency Medicine | Admitting: Emergency Medicine

## 2018-06-26 ENCOUNTER — Other Ambulatory Visit: Payer: Self-pay

## 2018-06-26 ENCOUNTER — Encounter (HOSPITAL_COMMUNITY): Payer: Self-pay

## 2018-06-26 DIAGNOSIS — R079 Chest pain, unspecified: Secondary | ICD-10-CM | POA: Diagnosis present

## 2018-06-26 DIAGNOSIS — Z85850 Personal history of malignant neoplasm of thyroid: Secondary | ICD-10-CM | POA: Diagnosis not present

## 2018-06-26 DIAGNOSIS — R0789 Other chest pain: Secondary | ICD-10-CM

## 2018-06-26 DIAGNOSIS — E039 Hypothyroidism, unspecified: Secondary | ICD-10-CM | POA: Diagnosis not present

## 2018-06-26 DIAGNOSIS — R072 Precordial pain: Secondary | ICD-10-CM

## 2018-06-26 DIAGNOSIS — M546 Pain in thoracic spine: Secondary | ICD-10-CM | POA: Insufficient documentation

## 2018-06-26 DIAGNOSIS — Z79899 Other long term (current) drug therapy: Secondary | ICD-10-CM | POA: Diagnosis not present

## 2018-06-26 DIAGNOSIS — M549 Dorsalgia, unspecified: Secondary | ICD-10-CM

## 2018-06-26 LAB — CBC
HCT: 41.1 % (ref 36.0–46.0)
Hemoglobin: 12.2 g/dL (ref 12.0–15.0)
MCH: 23.5 pg — ABNORMAL LOW (ref 26.0–34.0)
MCHC: 29.7 g/dL — ABNORMAL LOW (ref 30.0–36.0)
MCV: 79.2 fL — ABNORMAL LOW (ref 80.0–100.0)
PLATELETS: 262 10*3/uL (ref 150–400)
RBC: 5.19 MIL/uL — ABNORMAL HIGH (ref 3.87–5.11)
RDW: 14.9 % (ref 11.5–15.5)
WBC: 7.7 10*3/uL (ref 4.0–10.5)
nRBC: 0 % (ref 0.0–0.2)

## 2018-06-26 LAB — I-STAT TROPONIN, ED: TROPONIN I, POC: 0.02 ng/mL (ref 0.00–0.08)

## 2018-06-26 LAB — I-STAT BETA HCG BLOOD, ED (MC, WL, AP ONLY): I-stat hCG, quantitative: <5 m[IU]/mL (ref <5)

## 2018-06-26 LAB — BASIC METABOLIC PANEL
ANION GAP: 4 — AB (ref 5–15)
BUN: 9 mg/dL (ref 6–20)
CO2: 28 mmol/L (ref 22–32)
CREATININE: 0.77 mg/dL (ref 0.44–1.00)
Calcium: 9.2 mg/dL (ref 8.9–10.3)
Chloride: 107 mmol/L (ref 98–111)
GFR calc Af Amer: >60 mL/min (ref >60)
GFR calc non Af Amer: >60 mL/min (ref >60)
Glucose, Bld: 90 mg/dL (ref 70–99)
Potassium: 4.1 mmol/L (ref 3.5–5.1)
Sodium: 139 mmol/L (ref 135–145)

## 2018-06-26 MED ORDER — ACETAMINOPHEN 500 MG PO TABS
1000.0000 mg | ORAL_TABLET | Freq: Once | ORAL | Status: AC
  Administered 2018-06-26: 1000 mg via ORAL
  Filled 2018-06-26: qty 2

## 2018-06-26 MED ORDER — SODIUM CHLORIDE 0.9% FLUSH: 3.0000 mL | Freq: Once | INTRAVENOUS | Status: DC

## 2018-06-26 MED ORDER — IBUPROFEN 400 MG PO TABS
400.0000 mg | ORAL_TABLET | Freq: Once | ORAL | Status: AC
  Administered 2018-06-26: 400 mg via ORAL
  Filled 2018-06-26: qty 1

## 2018-06-26 MED ORDER — METHOCARBAMOL 750 MG PO TABS: 750.0000 mg | ORAL_TABLET | Freq: Three times a day (TID) | ORAL | 0 refills | Status: DC | PRN

## 2018-06-26 NOTE — ED Triage Notes (Signed)
Pt reports chest pain X2 days. States some shortness of breath and radiation to her back. Denies cardiac hx.

## 2018-06-26 NOTE — ED Notes (Signed)
Signature pad not working in room. Pt unable to sign for discharge. Pt given discharge instructions, prescription, follow up information. Pt verbalized understanding and given the opportunity to ask questions.

## 2018-06-26 NOTE — Discharge Instructions (Addendum)
It was our pleasure to provide your ER care today - we hope that you feel better.  Your xray, ecg, and heart tests/lab work looks good.   Take ibuprofen or aleve as need for symptom relief. You may also try robaxin (muscle relaxer) as need for muscle pain/spasm - no driving when taking.  Follow up with primary care doctor in the coming week for recheck.  Return to ER if worse, new symptoms, fevers, increased trouble breathing, persistent/recurrent chest pain, other concern.

## 2018-06-26 NOTE — ED Notes (Signed)
Pt reporting that her chest pain started in her shoulder but has moved to the left side of her chest over the last two days. Pt reports that it worsens anytime she moves her upper body.

## 2018-06-26 NOTE — ED Provider Notes (Signed)
[place] EMERGENCY DEPARTMENT Provider Note   CSN: [id number] Arrival date & time: 06/26/18  1320    History   Chief Complaint Chief Complaint  Patient presents with  . Chest Pain    HPI Anne Krause is a 53 y.o. female.     Patient c/o pain to left chest and left upper back/shoulder for the past 3-4 days. Symptoms occur at rest, constant, dull, moderate, non radiating. Pain is reproduced/worsens with change in position, sitting from lying position, lying from sitting, turning torso, palp chest and left trapezius/scapular area. Patient denies trauma/injury. No neck or radicular pain. No associated sob, nv or diaphoresis. No personal or fam hx cad. No pleuritic pain. No leg pain or swelling. Non smoker. No hx trauma, travel/immobility, or surgery. No hx dvt or pe. Denies heartburn/indigestion. No abd pain or nv. No fever or chills. No cough or uri c/o.   The history is provided by the patient.  Chest Pain  Associated symptoms: back pain   Associated symptoms: no abdominal pain, no cough, no fever, no headache, no numbness, no palpitations, no shortness of breath, no vomiting and no weakness     Past Medical History:  Diagnosis Date  . Blood transfusion without reported diagnosis   . Cancer [facility]) 2002   thyroid  . Family history of breast cancer   . Family history of uterine cancer   . Thyroid disease     Patient Active Problem List   Diagnosis Date Noted  . Family history of uterine cancer   . Symptomatic menopausal or female climacteric states 08/04/2016  . Thyroid cancer ([facility]) 05/18/2015  . Angio-edema 07/12/2014  . 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 Ovarian cyst 04/10/2013  . Urinary leakage 04/10/2013  . Family history of breast cancer in mother 03/28/2013  . Family history of diabetes mellitus 03/28/2013  . H/O hysterectomy for benign disease 03/28/2013  . History of thyroid cancer 03/28/2013  . Hypothyroidism 03/28/2013  . Obesity  03/28/2013  . Bright red rectal bleeding 03/14/2013    Past Surgical History:  Procedure Laterality Date  . ABDOMINAL HYSTERECTOMY    . MYOMECTOMY    . SHOULDER SURGERY    . THYROIDECTOMY       OB History   No obstetric history on file.      Home Medications    Prior to Admission medications   Medication Sig Start Date End Date Taking? Authorizing Provider  estradiol (VIVELLE-DOT) 0.025 MG/24HR  11/29/17   [provider]  levothyroxine (SYNTHROID, LEVOTHROID) 125 MCG tablet Take 125 mcg by mouth daily before breakfast.    [provider]    Family History Family History  Problem Relation Age of Onset  . Breast cancer Mother 24       radiation, tamoxifen for 2 years  . Uterine cancer Mother 21  . Stroke Maternal Grandmother   . Colon cancer Neg Hx   . Esophageal cancer Neg Hx   . Rectal cancer Neg Hx   . Stomach cancer Neg Hx   . Colon polyps Neg Hx     Social History Social History   Tobacco Use  . Smoking status: Never Smoker  . Smokeless tobacco: Never Used  Substance Use Topics  . Alcohol use: No  . Drug use: No     Allergies   Patient has no known allergies.   Review of Systems Review of Systems  Constitutional: Negative for fever.  HENT: Negative for sore throat.   Eyes: Negative  for redness.  Respiratory: Negative for cough and shortness of breath.   Cardiovascular: Positive for chest pain. Negative for palpitations and leg swelling.  Gastrointestinal: Negative for abdominal pain and vomiting.  Genitourinary: Negative for flank pain.  Musculoskeletal: Positive for back pain. Negative for neck pain.  Skin: Negative for rash.  Neurological: Negative for weakness, numbness and headaches.  Hematological: Does not bruise/bleed easily.  Psychiatric/Behavioral: Negative for confusion.     Physical Exam Updated Vital Signs BP 132/90   Pulse 66   Temp 97.7 F (36.5 C) (Oral)   Resp 11   SpO2 98%   Physical Exam Vitals  signs and nursing note reviewed.  Constitutional:      Appearance: Normal appearance. She is well-developed.  HENT:     Head: Atraumatic.     Nose: Nose normal.     Mouth/Throat:     Mouth: Mucous membranes are moist.  Eyes:     General: No scleral icterus.    Conjunctiva/sclera: Conjunctivae normal.     Pupils: Pupils are equal, round, and reactive to light.  Neck:     Musculoskeletal: Normal range of motion and neck supple. No neck rigidity or muscular tenderness.     Trachea: No tracheal deviation.  Cardiovascular:     Rate and Rhythm: Normal rate and regular rhythm.     Pulses: Normal pulses.     Heart sounds: Normal heart sounds. No murmur. No friction rub. No gallop.   Pulmonary:     Effort: Pulmonary effort is normal. No respiratory distress.     Breath sounds: Normal breath sounds.  Chest:     Chest wall: Tenderness present.  Abdominal:     General: Bowel sounds are normal. There is no distension.     Palpations: Abdomen is soft.     Tenderness: There is no abdominal tenderness. There is no guarding.  Genitourinary:    Comments: No cva tenderness.  Musculoskeletal:        General: No swelling.     Right lower leg: No edema.     Left lower leg: No edema.     Comments: C/T spine non tender, aligned. Left trapezius, and left upper back muscular tenderness, reproducing symptoms.   Skin:    General: Skin is warm and dry.     Findings: No rash.  Neurological:     Mental Status: She is alert.     Comments: Alert, speech normal. Steady gait.   Psychiatric:        Mood and Affect: Mood normal.      ED Treatments / Results  Labs (all labs ordered are listed, but only abnormal results are displayed) Results for orders placed or performed during the hospital encounter of 40/98/11  Basic metabolic panel  Result Value Ref Range   Sodium 139 135 - 145 mmol/L   Potassium 4.1 3.5 - 5.1 mmol/L   Chloride 107 98 - 111 mmol/L   CO2 28 22 - 32 mmol/L   Glucose, Bld 90 70 -  99 mg/dL   BUN 9 6 - 20 mg/dL   Creatinine, Ser 0.77 0.44 - 1.00 mg/dL   Calcium 9.2 8.9 - 10.3 mg/dL   GFR calc non Af Amer >60 >60 mL/min   GFR calc Af Amer >60 >60 mL/min   Anion gap 4 (L) 5 - 15  CBC  Result Value Ref Range   WBC 7.7 4.0 - 10.5 K/uL   RBC 5.19 (H) 3.87 - 5.11 MIL/uL   Hemoglobin  12.2 12.0 - 15.0 g/dL   HCT 41.1 36.0 - 46.0 %   MCV 79.2 (L) 80.0 - 100.0 fL   MCH 23.5 (L) 26.0 - 34.0 pg   MCHC 29.7 (L) 30.0 - 36.0 g/dL   RDW 14.9 11.5 - 15.5 %   Platelets 262 150 - 400 K/uL   nRBC 0.0 0.0 - 0.2 %  I-stat troponin, ED  Result Value Ref Range   Troponin i, poc 0.02 0.00 - 0.08 ng/mL   Comment 3          I-Stat beta hCG blood, ED  Result Value Ref Range   I-stat hCG, quantitative <5.0 <5 mIU/mL   Comment 3             EKG EKG Interpretation  Date/Time:  Tuesday June 26 2018 13:25:38 EST Ventricular Rate:  77 PR Interval:  136 QRS Duration: 78 QT Interval:  370 QTC Calculation: 418 R Axis:   84 Text Interpretation:  Normal sinus rhythm Normal ECG Confirmed by Lajean Saver [phone]) on 06/26/2018 1:45:16 PM Also confirmed by Lajean Saver [phone]), editor Philomena Doheny [phone])  on 06/26/2018 2:56:01 PM   Radiology Dg Chest 2 View  Result Date: 06/26/2018 CLINICAL DATA:  Severe left shoulder pain EXAM: CHEST - 2 VIEW COMPARISON:  None. FINDINGS: The heart size and mediastinal contours are within normal limits. Both lungs are clear. The visualized skeletal structures are unremarkable. IMPRESSION: No acute abnormality of the lungs.  No focal airspace opacity Electronically Signed   By: Eddie Candle M.D.   On: 06/26/2018 15:16    Procedures Procedures (including critical care time)  Medications Ordered in ED Medications  sodium chloride flush (NS) 0.9 % injection 3 mL (has no administration in time range)  acetaminophen (TYLENOL) tablet 1,000 mg (has no administration in time range)  ibuprofen (ADVIL,MOTRIN) tablet 400 mg (has no administration in time  range)     Initial Impression / Assessment and Plan / ED Course  I have reviewed the triage vital signs and the nursing notes.  Pertinent labs & imaging results that were available during my care of the patient were reviewed by me and considered in my medical decision making (see chart for details).  Labs. Cxr. Ecg.  Reviewed nursing notes and prior charts for additional history.   After symptoms present/constant x 3-4 days, trop is normal. Symptoms are reproduced with movement on stretcher, turning, sitting, lying back, palpation - symptoms appear most c/w msk pain, and not c/w acs.   No meds pta, pt drove self. Ibuprofen po, acetaminophen po.  Vitals normal, pt currently appears stable for d/c.   rec nsaid, will give rx robaxin. rec close outpt pcp f/u.  Return precautions provided.     Final Clinical Impressions(s) / ED Diagnoses   Final diagnoses:  None    ED Discharge Orders    None       Lajean Saver, MD 06/26/18 1526

## 2018-07-11 ENCOUNTER — Telehealth: Payer: Self-pay

## 2018-07-11 NOTE — Telephone Encounter (Signed)
Incoming call from Santiago Glad -Teaching laboratory technician from [facility] [employer].  Appt made for April 1st at 11:30 am. Arrive 20 min early. Valet info given.  She will attempt to reach pt to let her know about appt and if she can't reach her - then she will send out an appt letter.

## 2018-07-11 NOTE — Telephone Encounter (Signed)
Outgoing call to patient to schedule appt here (received a referral) - no answer, left VM.  Left message for Santiago Glad - referral coordinator also at [facility].

## 2018-07-24 ENCOUNTER — Telehealth: Payer: Self-pay | Admitting: *Deleted

## 2018-07-24 NOTE — Telephone Encounter (Signed)
Attempted to contact the patient to prescreen her for her appt tomorrow. Left the patient a message to call the office back.

## 2018-07-24 NOTE — Telephone Encounter (Signed)
Patient's husband returned call and answered questions regarding the appt tomorrow. Patient has not had any signs/symptoms, traveled or known exposure. Explained that she will be asked again tomorrow at the front desk; along with a temperature. Explained the no visitor policy. 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

## 2018-07-24 NOTE — Telephone Encounter (Signed)
Called and left the patient a message to call the office back today before 4pm regarding her appt for tomorrow. The patient needs to be prescreened.

## 2018-07-25 ENCOUNTER — Other Ambulatory Visit: Payer: Self-pay | Admitting: Obstetrics and Gynecology

## 2018-07-25 ENCOUNTER — Inpatient Hospital Stay: Payer: PRIVATE HEALTH INSURANCE | Attending: Gynecologic Oncology | Admitting: Gynecologic Oncology

## 2018-07-25 ENCOUNTER — Other Ambulatory Visit: Payer: Self-pay

## 2018-07-25 ENCOUNTER — Telehealth: Payer: Self-pay | Admitting: *Deleted

## 2018-07-25 ENCOUNTER — Encounter: Payer: Self-pay | Admitting: Gynecologic Oncology

## 2018-07-25 VITALS — BP 126/68 | HR 76 | Temp 98.4°F | Resp 20 | Ht 65.0 in | Wt 220.7 lb

## 2018-07-25 DIAGNOSIS — N83202 Unspecified ovarian cyst, left side: Secondary | ICD-10-CM | POA: Insufficient documentation

## 2018-07-25 DIAGNOSIS — E039 Hypothyroidism, unspecified: Secondary | ICD-10-CM | POA: Insufficient documentation

## 2018-07-25 DIAGNOSIS — Z791 Long term (current) use of non-steroidal anti-inflammatories (NSAID): Secondary | ICD-10-CM | POA: Insufficient documentation

## 2018-07-25 DIAGNOSIS — Z853 Personal history of malignant neoplasm of breast: Secondary | ICD-10-CM | POA: Diagnosis not present

## 2018-07-25 DIAGNOSIS — Z90710 Acquired absence of both cervix and uterus: Secondary | ICD-10-CM | POA: Insufficient documentation

## 2018-07-25 DIAGNOSIS — Z6837 Body mass index (BMI) 37.0-37.9, adult: Secondary | ICD-10-CM | POA: Diagnosis not present

## 2018-07-25 DIAGNOSIS — E669 Obesity, unspecified: Secondary | ICD-10-CM | POA: Diagnosis not present

## 2018-07-25 DIAGNOSIS — Z79899 Other long term (current) drug therapy: Secondary | ICD-10-CM | POA: Diagnosis not present

## 2018-07-25 DIAGNOSIS — Z803 Family history of malignant neoplasm of breast: Secondary | ICD-10-CM | POA: Insufficient documentation

## 2018-07-25 NOTE — Progress Notes (Signed)
Consult Note: Gyn-Onc  Consult was requested by Dr. Royston Sinner for the evaluation of Anne Krause 53 y.o. female  CC:  Chief Complaint  Patient presents with  . Cyst of left ovary    Assessment/Plan:  Ms. Anne Krause  is a 53 y.o.  year old with a persistent left ovarian cyst (with a solid component) and normal CA 125. I discussed with Anne Krause that the natural history of this lesion which has overall decreased in size and has been associated with normal tumor markers for several years suggest that it is benign.  It is completely reasonable to observe this cystic lesion.  1 year intervals for repeat surveillance ultrasounds seem reasonable.  If this continues to remain stable and unchanged in appearance over 2 to 3 years of follow-up, it is reasonable at that point in time to discontinue routine surveillance, and only reinstitute scans if it becomes newly symptomatic.  I also discussed with Anne Krause the possibility of offering her surgery to remove the cystic mass.  This is also reasonable as she has some degree of anxiety that is associated with the long-term follow-up of this lesion.  I reassured her that cancer does not develop within an already benign cystic lesion, but rather occurs de novo in the surrounding ovarian tissue.  However I feel that she is an acceptable surgical risk to proceed with left salpingo-oophorectomy if she elects for this.  I explained that removal of the left tube and ovary will result in her transitioning to an abrupt surgical menopause.  Is unclear if she is completed her transition into menopause naturally.  Her mother has a history of both breast and what sounds like uterine cancer and therefore it is possible the patient has increased risk for gynecologic cancers.  Therefore is not unreasonable to consider removing the ovary.  The patient will consider her options and how she desires to proceed.  She will contact our office when she is determine how she would like to  proceed either with surgery or with ongoing surveillance.  I briefly discussed the surgery with her should she decide to proceed.  We discussed that this will likely take place via minimally invasive procedure with laparoscopic surgery with robotic assistance.  She will likely need lysis of adhesions given multiple prior abdominal surgeries.  I explained surgical risks including  bleeding, infection, damage to internal organs (such as bladder,ureters, bowels), blood clot, reoperation and rehospitalization. I explained that her history of obesity and multiple prior abdominal surgeries places her at increased risk for these.  She will contact the office over the next few weeks when she is determine how she would like to proceed with either surveillance or surgery.   HPI: [name] is a 53 year old woman who was seen in consultation at the request of Dr. Royston Sinner for persistent left ovarian cystic lesion.  The patient reports having a laparoscopic hysterectomy performed in January 2012 at [place] for a fibroid uterus.  Prior to that time she had to laparotomy incisions for abdominal myomectomies.  At the time of her robotic hysterectomy and right salpingo-oophorectomy she was noted to have extensive adhesive disease.  Following that surgery she developed a symptomatic left ovarian cyst which in 2016 measured 7 cm.  It was monitored with serial ultrasounds and decreased in size until it became approximately 3 cm in February 2020.  However at that time a 14 mm solid component was also identified.  At that time Ca1 25 remained normal at 12.  Specifications of the ultrasound performed on June 26, 2018 include the identification of a surgically absent uterus and right tube and ovary.  The left ovary measured 4.37 x 2.51 x 3.53 cm.  It contained a 14 x 13 mm solid-appearing mass seen and a 2.7 x 1.7 cm and 1.6 x 1.4 cm simple appearing cyst.  No blood flow was seen within the mass of the cyst.  The patient is  asymptomatic from the cystic lesions denies pelvic pain.  Her past medical history is significant for obesity (BMI 37 kg/m), hypothyroidism, and history of an abscess of the right breast.  Her family history significant for her mother having history of malignant breast cancer, she also then developed what sounds like uterine cancer later in life.  Her breast cancer was in her 43s.  Current Meds:  Outpatient Encounter Medications as of 07/25/2018  Medication Sig  . Calcium Carb-Cholecalciferol (CALCIUM+D3 PO) Take 1 tablet by mouth daily.  Marland Kitchen estradiol (VIVELLE-DOT) 0.025 MG/24HR Place 1 patch onto the skin 2 (two) times a week.   . levothyroxine (SYNTHROID, LEVOTHROID) 125 MCG tablet Take 125 mcg by mouth daily before breakfast.  . meloxicam (MOBIC) 15 MG tablet meloxicam 15 mg tablet  Take 1 tablet every day by oral route.  . Multiple Vitamins-Calcium (ONE-A-DAY WOMENS FORMULA PO) Take 1 tablet by mouth daily.  . [DISCONTINUED] methocarbamol (ROBAXIN) 750 MG tablet Take 1 tablet (750 mg total) by mouth 3 (three) times daily as needed (muscle spasm/pain).   No facility-administered encounter medications on file as of 07/25/2018.     Allergy: No Known Allergies  Social Hx:   Social History   Socioeconomic History  . Marital status: Married    Spouse name: Not on file  . Number of children: Not on file  . Years of education: Not on file  . Highest education level: Not on file  Occupational History  . Not on file  Social Needs  . Financial resource strain: Not on file  . Food insecurity:    Worry: Not on file    Inability: Not on file  . Transportation needs:    Medical: Not on file    Non-medical: Not on file  Tobacco Use  . Smoking status: Never Smoker  . Smokeless tobacco: Never Used  Substance and Sexual Activity  . Alcohol use: No  . Drug use: No  . Sexual activity: Yes  Lifestyle  . Physical activity:    Days per week: Not on file    Minutes per session: Not on file   . Stress: Not on file  Relationships  . Social connections:    Talks on phone: Not on file    Gets together: Not on file    Attends religious service: Not on file    Active member of club or organization: Not on file    Attends meetings of clubs or organizations: Not on file    Relationship status: Not on file  . Intimate partner violence:    Fear of current or ex partner: Not on file    Emotionally abused: Not on file    Physically abused: Not on file    Forced sexual activity: Not on file  Other Topics Concern  . Not on file  Social History Narrative  . Not on file    Past Surgical Hx:  Past Surgical History:  Procedure Laterality Date  . ABDOMINAL HYSTERECTOMY    . MYOMECTOMY    . SHOULDER SURGERY    .  THYROIDECTOMY      Past Medical Hx:  Past Medical History:  Diagnosis Date  . Blood transfusion without reported diagnosis   . Cancer [facility]) 2002   thyroid  . Family history of breast cancer   . Family history of uterine cancer   . Thyroid disease     Past Gynecological History:  No abnormal paps G0 No LMP recorded. Patient has had a hysterectomy.  Family Hx:  Family History  Problem Relation Age of Onset  . Breast cancer Mother 53       radiation, tamoxifen for 2 years  . Uterine cancer Mother 71  . Stroke Maternal Grandmother   . Colon cancer Neg Hx   . Esophageal cancer Neg Hx   . Rectal cancer Neg Hx   . Stomach cancer Neg Hx   . Colon polyps Neg Hx     Review of Systems:  Constitutional  Feels well,    ENT Normal appearing ears and nares bilaterally Skin/Breast  No rash, sores, jaundice, itching, dryness Cardiovascular  No chest pain, shortness of breath, or edema  Pulmonary  No cough or wheeze.  Gastro Intestinal  No nausea, vomitting, or diarrhoea. No bright red blood per rectum, no abdominal pain, change in bowel movement, or constipation.  Genito Urinary  No frequency, urgency, dysuria, no pelvic pain, no bleeding Musculo Skeletal   No myalgia, arthralgia, joint swelling or pain  Neurologic  No weakness, numbness, change in gait,  Psychology  No depression, anxiety, insomnia.   Vitals:  Blood pressure 126/68, pulse 76, temperature 98.4 F (36.9 C), temperature source Oral, resp. rate 20, height 5\' 5"  (1.651 m), weight 220 lb 11.2 oz (100.1 kg), SpO2 100 %.  Physical Exam: WD in NAD Neck  Supple NROM, without any enlargements.  Lymph Node Survey No cervical supraclavicular or inguinal adenopathy Cardiovascular  Pulse normal rate, regularity and rhythm. S1 and S2 normal.  Lungs  Clear to auscultation bilateraly, without wheezes/crackles/rhonchi. Good air movement.  Skin  No rash/lesions/breakdown  Psychiatry  Alert and oriented to person, place, and time  Abdomen  Normoactive bowel sounds, abdomen soft, non-tender and obese without evidence of hernia.  Back No CVA tenderness Genito Urinary  Vulva/vagina: Normal external female genitalia.   No lesions. No discharge or bleeding.  Bladder/urethra:  No lesions or masses, well supported bladder  Vagina: normal  Cervix and uterus surgical absent  Adnexa: no palpable masses. Rectal  Good tone, no masses no cul de sac nodularity.  Extremities  No bilateral cyanosis, clubbing or edema.   Thereasa Solo, MD  07/25/2018, 6:22 PM

## 2018-07-25 NOTE — Telephone Encounter (Signed)
mistake

## 2018-07-25 NOTE — Patient Instructions (Signed)
Dr Denman George has a low suspicion that this mass is a cancer. She is offering either that you have follow-up of the mass/cyst in 1 year with Dr Royston Sinner to monitor it for growth, however, cancers do not arise in benign cysts.  The other option is to have the ovary removed. This can take place after June, 2020 if you would like. This will likely be performed robotically like your hysterectomy was performed.  If you would like to proceed with surgery, notify Dr Denman George at [phone].

## 2018-07-31 ENCOUNTER — Other Ambulatory Visit: Payer: Self-pay | Admitting: Obstetrics and Gynecology

## 2018-07-31 DIAGNOSIS — N611 Abscess of the breast and nipple: Secondary | ICD-10-CM

## 2019-08-30 IMAGING — US ULTRASOUND RIGHT BREAST LIMITED
1 series · 9 of 9 positions shown · non-contrast
Comparison: Previous exam(s).

CLINICAL DATA: 52-year-old female presents for follow-up right
breast abscess. The patient states this area continues to drain on
its own and slowly improved. She has completed her course of
antibiotics.

EXAM:
ULTRASOUND OF THE RIGHT BREAST

[Series 1: ultrasound right breast limited · 0.06mm/px · 9 of 9 slices shown]
[im 1/9]
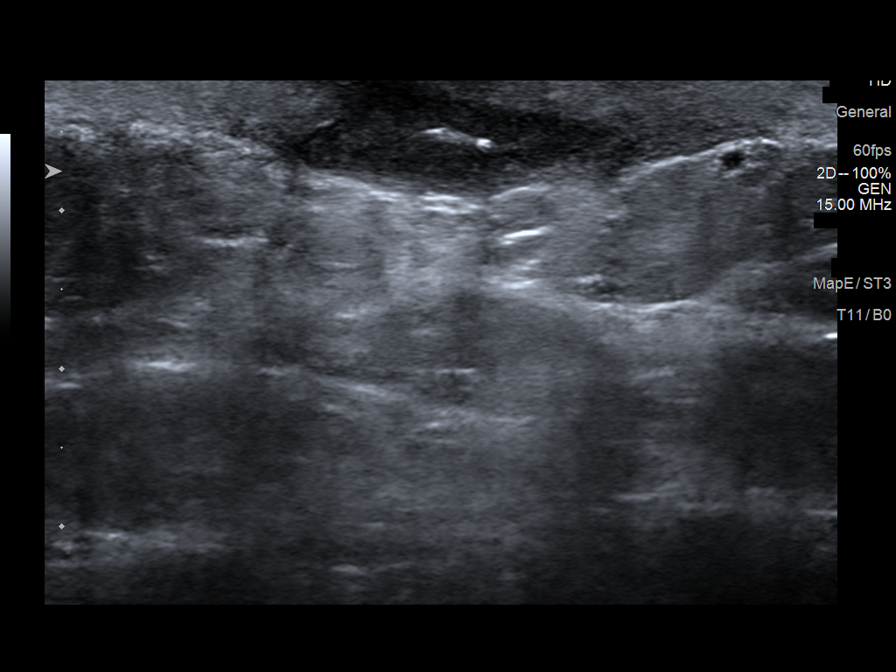
[im 2/9]
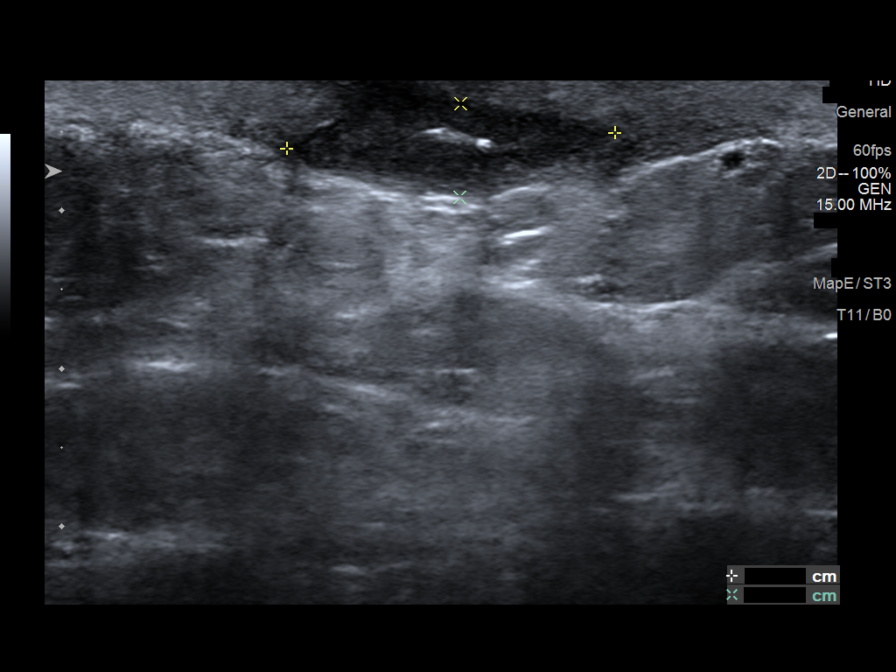
[im 3/9]
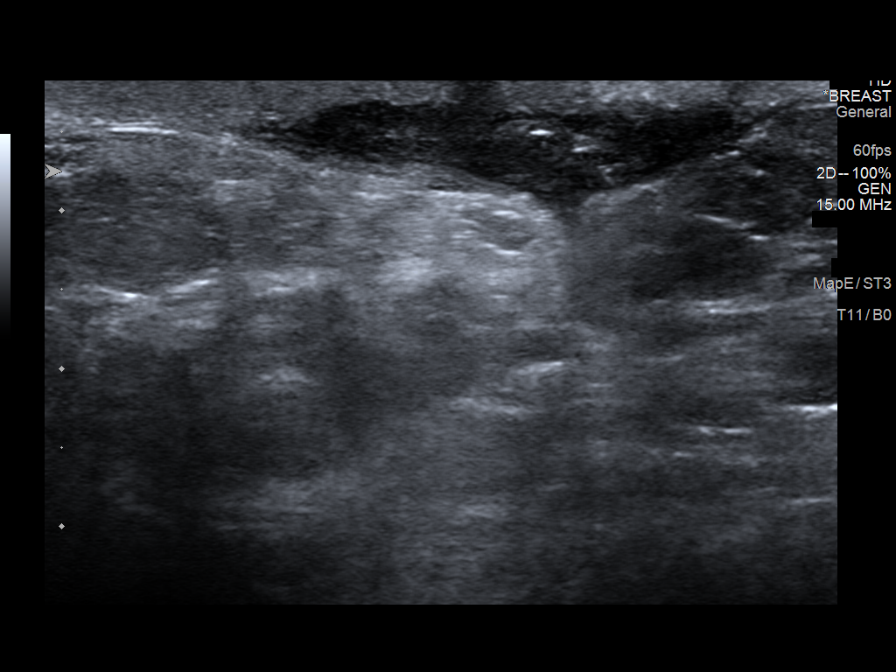
[im 4/9]
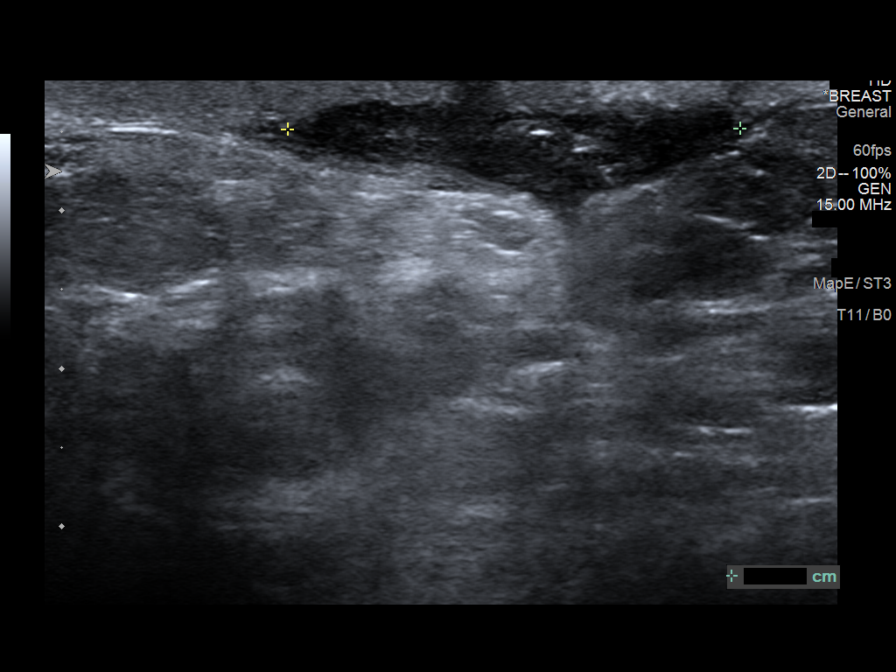
[im 5/9]
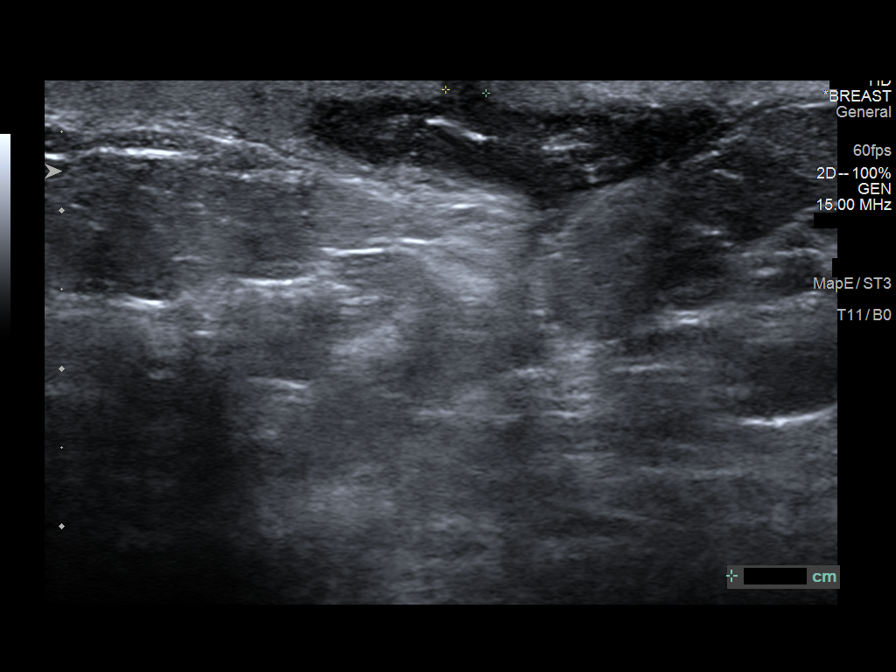
[im 6/9]
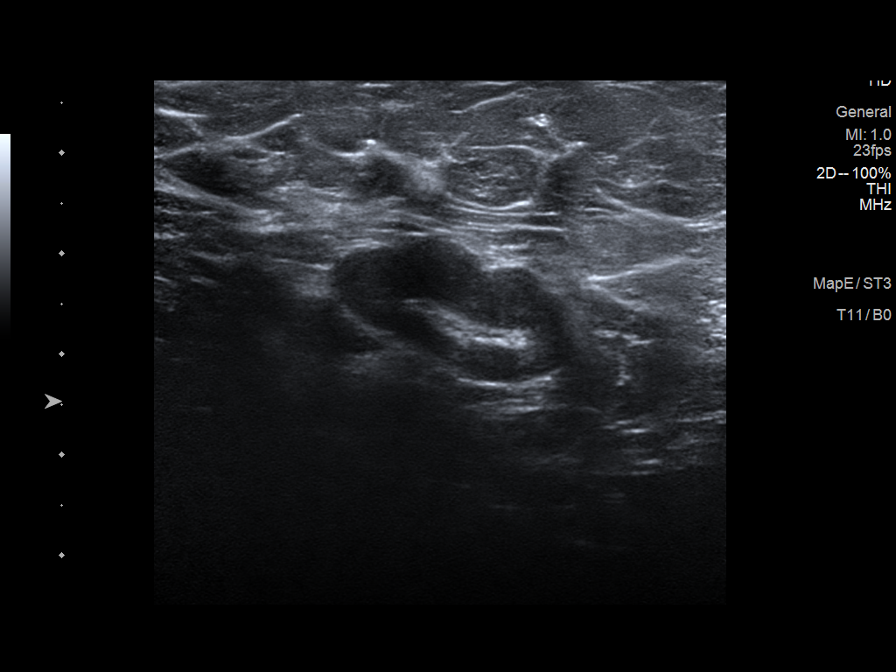
[im 7/9]
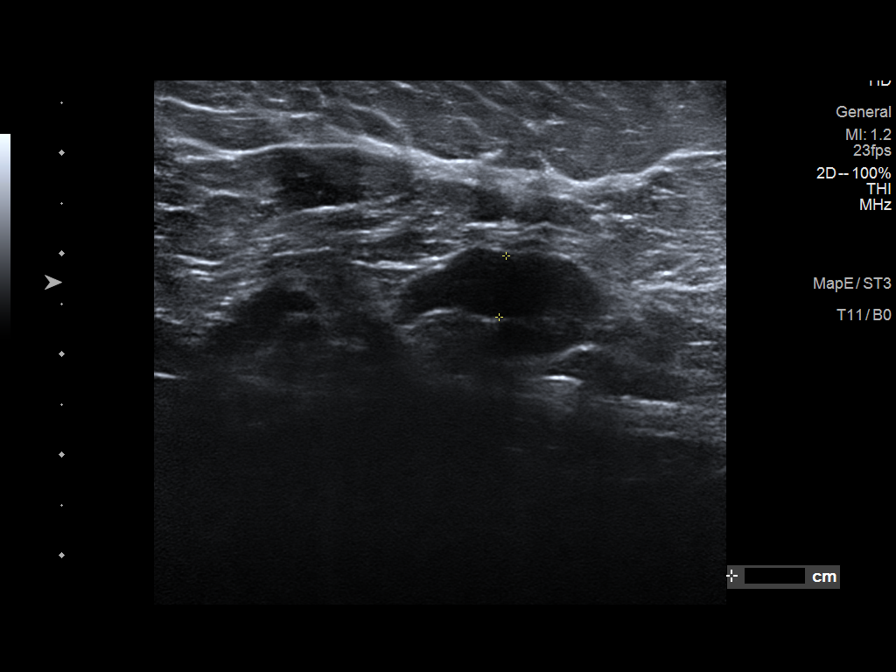
[im 8/9]
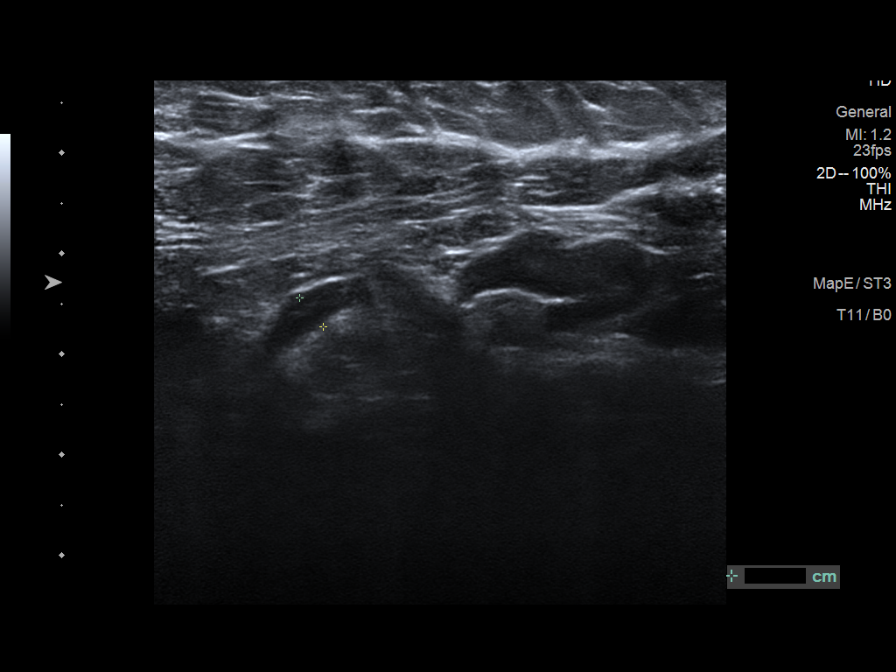
[im 9/9]
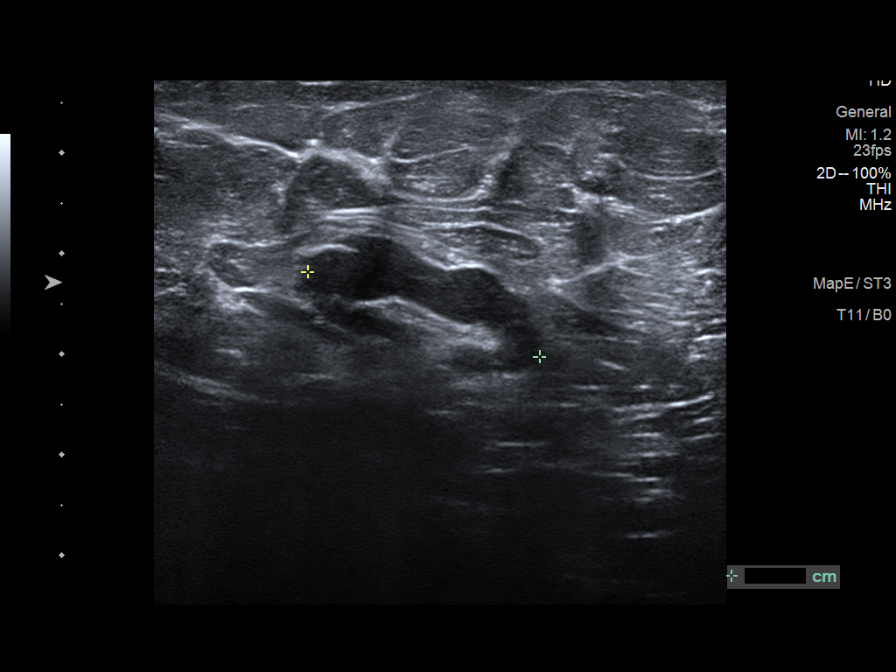

[9 of 9 positions shown; findings below may reference images not displayed]

FINDINGS: Physical exam of the right breast reveals mild firmness and erythema
of the skin of the right breast at 8 o'clock position near the
inframammary fold. The patient is nontender to palpation. No
drainage noted.

Targeted ultrasound of the right breast was performed. The
collection within the skin and extending to the skin surface at 8
o'clock 7 cm from nipple measures 2.1 x 0.6 x 2.9 cm, improved
initially measuring 2.8 x 1.1 x 3.6 cm. No new collections seen.

Targeted ultrasound of the right axilla was performed demonstrating
continued improvement in appearance of a lymph node with cortical
thickening in the right axilla.
IMPRESSION: 1. Resolving abscess within the skin of the right breast, possibly
an infected sebaceous cyst.

2.  Mild improvement in likely reactive axillary lymphadenopathy.

RECOMMENDATION:
The patient has been instructed to return in approximately 4-6 weeks
for repeat ultrasound of the right breast and right axilla to ensure
continued improvement/resolution of sonographic findings. If the
patient experiences any worsening of her symptoms in the interval,
she has been instructed to call the [REDACTED] and return sooner
for evaluation.

I have discussed the findings and recommendations with the patient.
Results were also provided in writing at the conclusion of the
visit. If applicable, a reminder letter will be sent to the patient
regarding the next appointment.

BI-RADS CATEGORY  3: Probably benign.

## 2019-10-23 IMAGING — DX DG CHEST 2V
2 series · 2 of 2 positions shown · non-contrast
Comparison: None.

CLINICAL DATA: Severe left shoulder pain

EXAM:
CHEST - 2 VIEW

[chest pa]
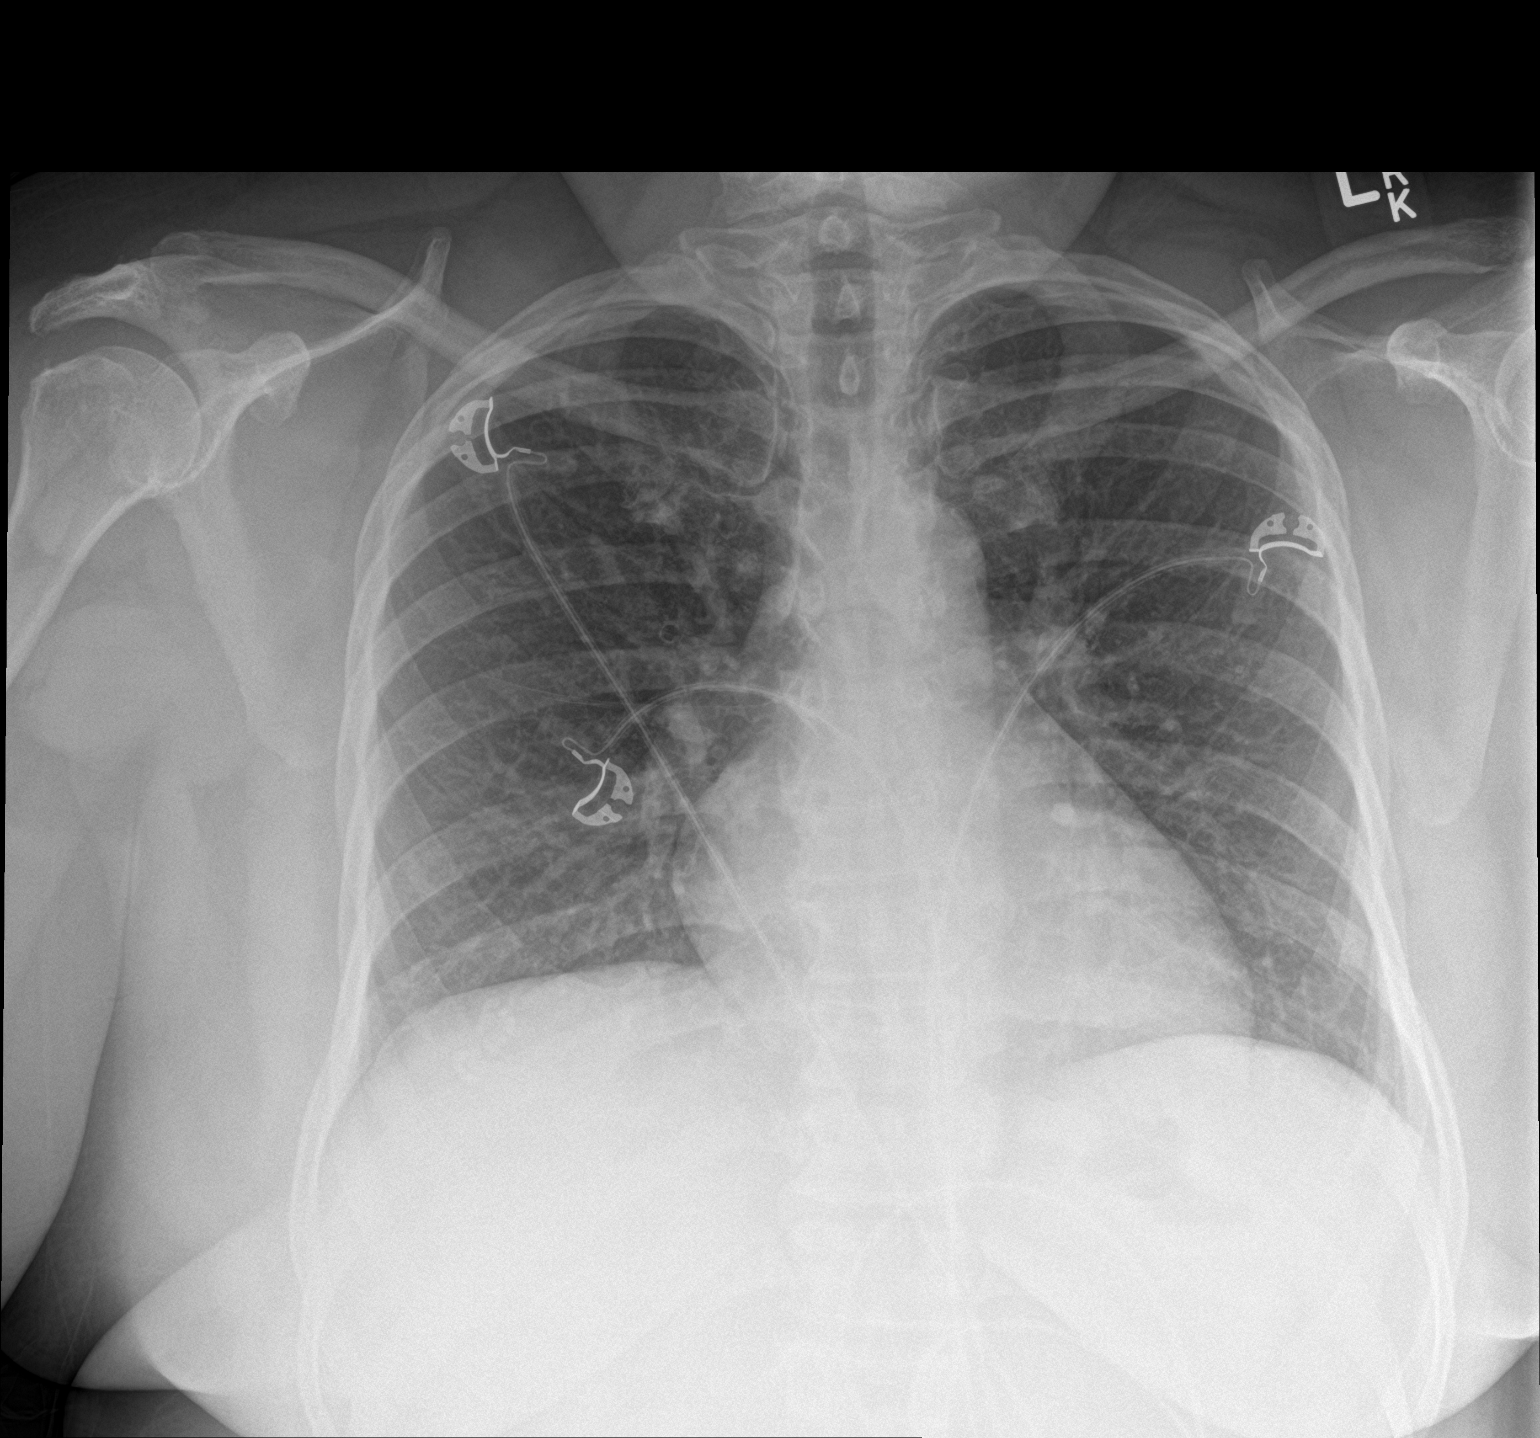

[chest lat]
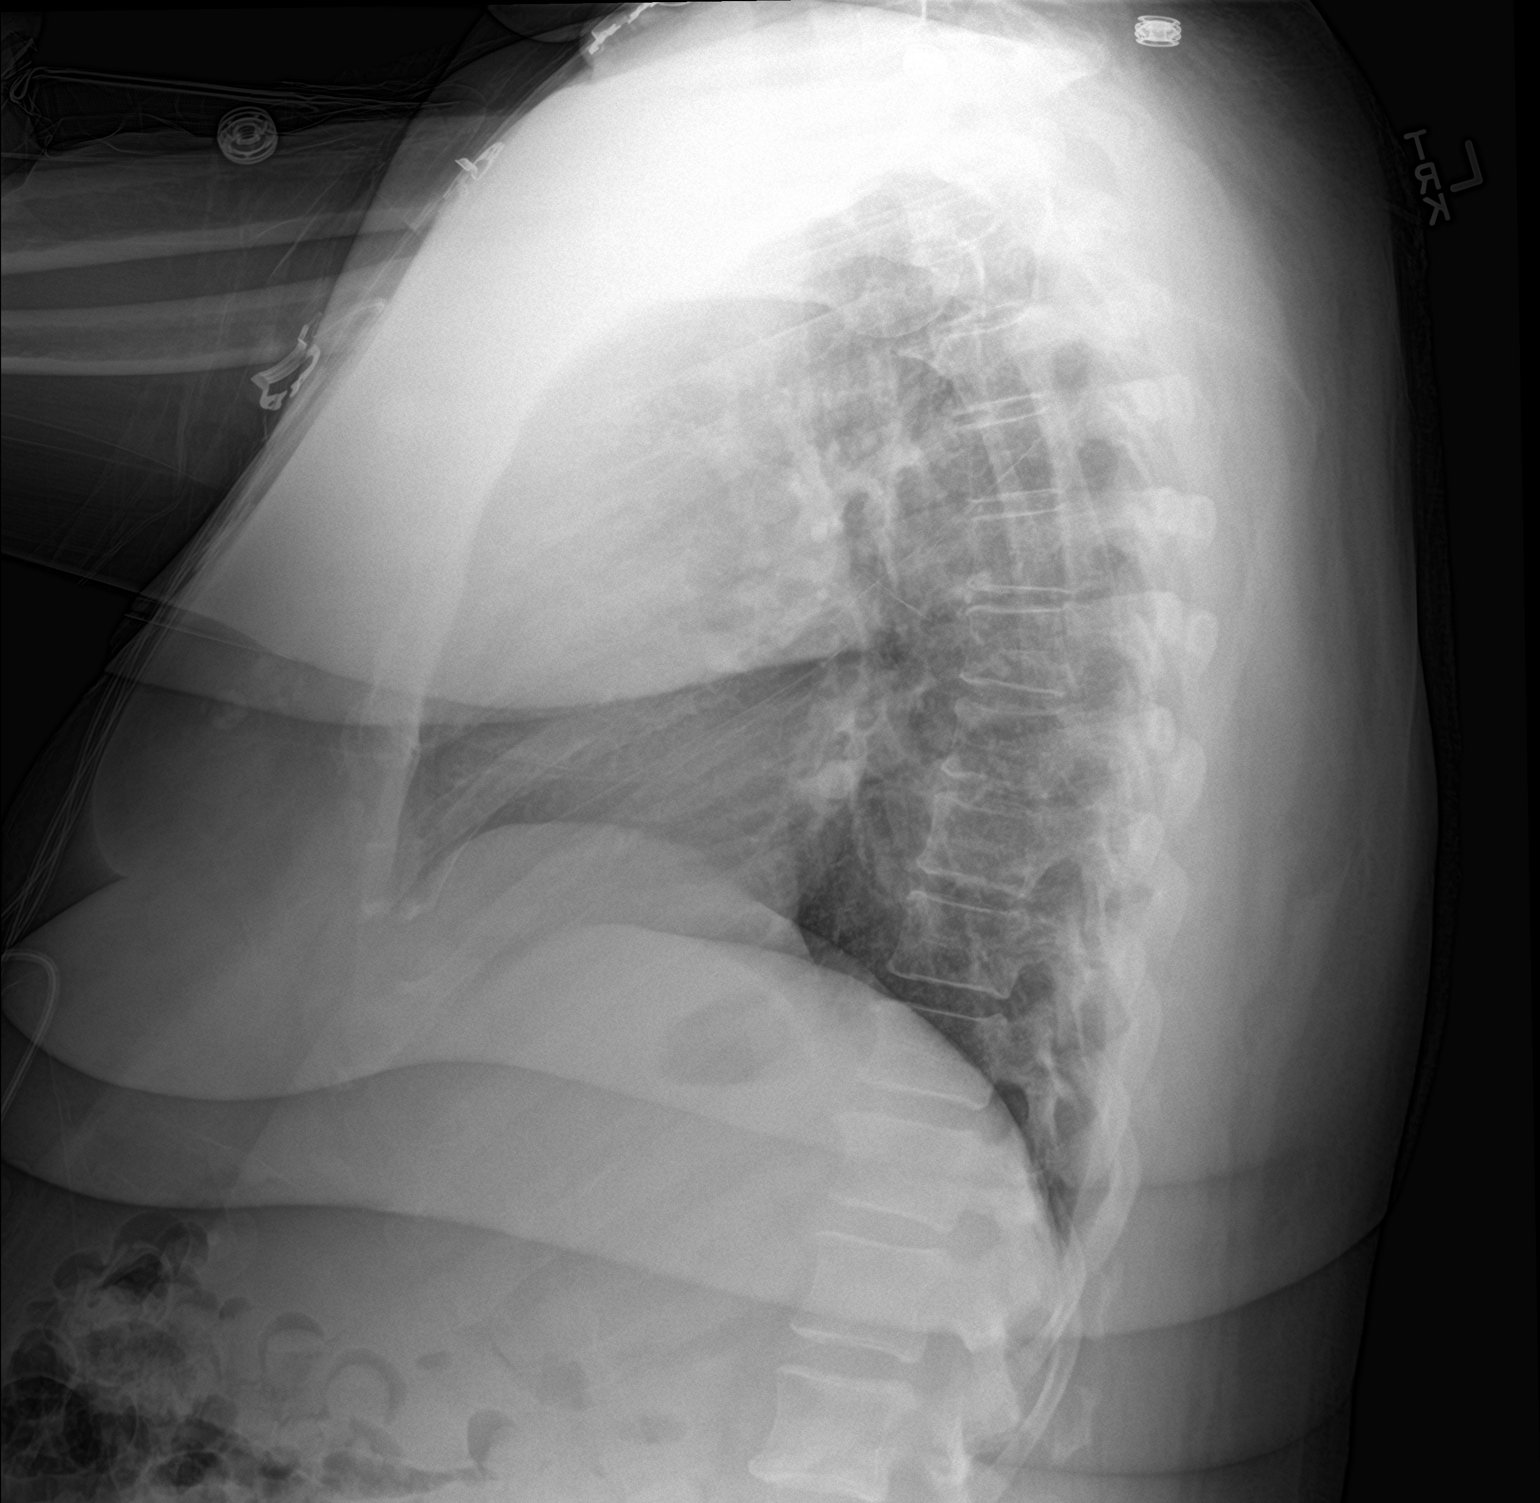

[2 of 2 positions shown; findings below may reference images not displayed]

FINDINGS: The heart size and mediastinal contours are within normal limits.
Both lungs are clear. The visualized skeletal structures are
unremarkable.
IMPRESSION: No acute abnormality of the lungs.  No focal airspace opacity

## 2021-08-02 ENCOUNTER — Ambulatory Visit: Payer: PRIVATE HEALTH INSURANCE | Admitting: Internal Medicine

## 2021-10-05 ENCOUNTER — Ambulatory Visit: Payer: PRIVATE HEALTH INSURANCE | Admitting: Internal Medicine

## 2021-11-26 ENCOUNTER — Ambulatory Visit: Payer: PRIVATE HEALTH INSURANCE | Attending: Internal Medicine | Admitting: Internal Medicine

## 2021-11-26 ENCOUNTER — Encounter: Payer: Self-pay | Admitting: Internal Medicine

## 2021-11-26 VITALS — BP 110/65 | HR 82 | Temp 98.1°F | Ht 65.0 in | Wt 222.4 lb

## 2021-11-26 DIAGNOSIS — E669 Obesity, unspecified: Secondary | ICD-10-CM

## 2021-11-26 DIAGNOSIS — E890 Postprocedural hypothyroidism: Secondary | ICD-10-CM

## 2021-11-26 DIAGNOSIS — Z7689 Persons encountering health services in other specified circumstances: Secondary | ICD-10-CM

## 2021-11-26 DIAGNOSIS — L720 Epidermal cyst: Secondary | ICD-10-CM | POA: Diagnosis not present

## 2021-11-26 MED ORDER — PHENTERMINE HCL 30 MG PO CAPS: 30.0000 mg | ORAL_CAPSULE | ORAL | 1 refills | Status: DC

## 2021-11-26 NOTE — Progress Notes (Signed)
Pt wants to discuss weight loss management.

## 2021-11-26 NOTE — Patient Instructions (Signed)
I have sent the prescription to your pharmacy for the phentermine.  Try to check your blood pressure at least once a week for the first few weeks of being on the medication.  Please let me know if you develop any palpitations.  I recommend using some antibiotic ointment/cream on the area of your right middle finger.  I would also do warm soaks about twice a week.  Healthy Eating Following a healthy eating pattern may help you to achieve and maintain a healthy body weight, reduce the risk of chronic disease, and live a long and productive life. It is important to follow a healthy eating pattern at an appropriate calorie level for your body. Your nutritional needs should be met primarily through food by choosing a variety of nutrient-rich foods. What are tips for following this plan? Reading food labels Read labels and choose the following: Reduced or low sodium. Juices with 100% fruit juice. Foods with low saturated fats and high polyunsaturated and monounsaturated fats. Foods with whole grains, such as whole wheat, cracked wheat, brown rice, and wild rice. Whole grains that are fortified with folic acid. This is recommended for women who are pregnant or who want to become pregnant. Read labels and avoid the following: Foods with a lot of added sugars. These include foods that contain brown sugar, corn sweetener, corn syrup, dextrose, fructose, glucose, high-fructose corn syrup, honey, invert sugar, lactose, malt syrup, maltose, molasses, raw sugar, sucrose, trehalose, or turbinado sugar. Do not eat more than the following amounts of added sugar per day: 6 teaspoons (25 g) for women. 9 teaspoons (38 g) for men. Foods that contain processed or refined starches and grains. Refined grain products, such as white flour, degermed cornmeal, white bread, and white rice. Shopping Choose nutrient-rich snacks, such as vegetables, whole fruits, and nuts. Avoid high-calorie and high-sugar snacks, such as  potato chips, fruit snacks, and candy. Use oil-based dressings and spreads on foods instead of solid fats such as butter, stick margarine, or cream cheese. Limit pre-made sauces, mixes, and "instant" products such as flavored rice, instant noodles, and ready-made pasta. Try more plant-protein sources, such as tofu, tempeh, black beans, edamame, lentils, nuts, and seeds. Explore eating plans such as the Mediterranean diet or vegetarian diet. Cooking Use oil to saut or stir-fry foods instead of solid fats such as butter, stick margarine, or lard. Try baking, boiling, grilling, or broiling instead of frying. Remove the fatty part of meats before cooking. Steam vegetables in water or broth. Meal planning  At meals, imagine dividing your plate into fourths: One-half of your plate is fruits and vegetables. One-fourth of your plate is whole grains. One-fourth of your plate is protein, especially lean meats, poultry, eggs, tofu, beans, or nuts. Include low-fat dairy as part of your daily diet. Lifestyle Choose healthy options in all settings, including home, work, school, restaurants, or stores. Prepare your food safely: Wash your hands after handling raw meats. Keep food preparation surfaces clean by regularly washing with hot, soapy water. Keep raw meats separate from ready-to-eat foods, such as fruits and vegetables. Cook seafood, meat, poultry, and eggs to the recommended internal temperature. Store foods at safe temperatures. In general: Keep cold foods at 1F (4.4C) or below. Keep hot foods at 11F (60C) or above. Keep your freezer at [facility] (-17.8C) or below. Foods are no longer safe to eat when they have been between the temperatures of 40-11F (4.4-60C) for more than 2 hours. What foods should I eat? Fruits Aim to eat  2 cup-equivalents of fresh, canned (in natural juice), or frozen fruits each day. Examples of 1 cup-equivalent of fruit include 1 small apple, 8 large  strawberries, 1 cup canned fruit,  cup dried fruit, or 1 cup 100% juice. Vegetables Aim to eat 2-3 cup-equivalents of fresh and frozen vegetables each day, including different varieties and colors. Examples of 1 cup-equivalent of vegetables include 2 medium carrots, 2 cups raw, leafy greens, 1 cup chopped vegetable (raw or cooked), or 1 medium baked potato. Grains Aim to eat 6 ounce-equivalents of whole grains each day. Examples of 1 ounce-equivalent of grains include 1 slice of bread, 1 cup ready-to-eat cereal, 3 cups popcorn, or  cup cooked rice, pasta, or cereal. Meats and other proteins Aim to eat 5-6 ounce-equivalents of protein each day. Examples of 1 ounce-equivalent of protein include 1 egg, 1/2 cup nuts or seeds, or 1 tablespoon (16 g) peanut butter. A cut of meat or fish that is the size of a deck of cards is about 3-4 ounce-equivalents. Of the protein you eat each week, try to have at least 8 ounces come from seafood. This includes salmon, trout, herring, and anchovies. Dairy Aim to eat 3 cup-equivalents of fat-free or low-fat dairy each day. Examples of 1 cup-equivalent of dairy include 1 cup (240 mL) milk, 8 ounces (250 g) yogurt, 1 ounces (44 g) natural cheese, or 1 cup (240 mL) fortified soy milk. Fats and oils Aim for about 5 teaspoons (21 g) per day. Choose monounsaturated fats, such as canola and olive oils, avocados, peanut butter, and most nuts, or polyunsaturated fats, such as sunflower, corn, and soybean oils, walnuts, pine nuts, sesame seeds, sunflower seeds, and flaxseed. Beverages Aim for six 8-oz glasses of water per day. Limit coffee to three to five 8-oz cups per day. Limit caffeinated beverages that have added calories, such as soda and energy drinks. Limit alcohol intake to no more than 1 drink a day for nonpregnant women and 2 drinks a day for men. One drink equals 12 oz of beer (355 mL), 5 oz of wine (148 mL), or 1 oz of hard liquor (44 mL). Seasoning and other  foods Avoid adding excess amounts of salt to your foods. Try flavoring foods with herbs and spices instead of salt. Avoid adding sugar to foods. Try using oil-based dressings, sauces, and spreads instead of solid fats. This information is based on general [place] nutrition guidelines. For more information, visit [employer]es. Exact amounts may vary based on your nutrition needs. Summary A healthy eating plan may help you to maintain a healthy weight, reduce the risk of chronic diseases, and stay active throughout your life. Plan your meals. Make sure you eat the right portions of a variety of nutrient-rich foods. Try baking, boiling, grilling, or broiling instead of frying. Choose healthy options in all settings, including home, work, school, restaurants, or stores. This information is not intended to replace advice given to you by your health care provider. Make sure you discuss any questions you have with your health care provider. Document Revised: 12/08/2020 Document Reviewed: 12/08/2020 Elsevier Patient Education  [place].

## 2021-11-26 NOTE — Progress Notes (Signed)
Patient ID: Anne Krause, female    DOB: 12/30/1965  MRN: [id number]  CC: New Patient (Initial Visit)   Subjective: Anne Krause is a 56 y.o. female who presents for new pt visit. 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 Alden Hipp with [facility] in [place].  He retired.  Hx of thyroid CA/Hypothyroid: Diagnosed with papillary thyroid CA in 2003 and treated with total thyroidectomy plus I-13.  Followed by [facility] endocrinology.  Last appointment with them was 10/20/2021.  TSH reading on that visit was 0.79 with goal to keep TSH in the mid to lower half of normal range. On brand name Synthroid.  Taking it consistently  Obesity:  wgh has been struggle since thyroid surgery.  Has tried [employer], tries to watch what she eats and exercise On Wgh Watchers for the past 4-5 mths. Walk 5-6 days a wk for 30 mins and does strength training 2-3 x/wk.  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 Wgh has fluctuated with 5-6 lbs loss then regains.   Was on Phentermine early last yr for about 3 mths.  Tolerated the med well.  Did not lose much wgh on it.   Discussed wgh management meds Wegovy and Saxenda with her endocrinologist recently and was told to find out from her insurance whether medications would be covered..  Wanted to discuss options to help with weight loss..  Patient complains of sore bump on the right middle finger that has been intermittent for the past 3 months.  Located just behind the fingernail.  No initiating factors or trauma to the finger.  She has been doing warm soaks and putting all of oil on it.  Sometimes it would drain and go away.  Started flaring up again this week.  HM:  had hysterectomy.  Had MMG this yr at [facility]. Declines Shingrix.  Patient Active Problem List    Diagnosis Date Noted   Family history of uterine cancer    Symptomatic menopausal or female climacteric states 08/04/2016   Thyroid cancer ([place]) 05/18/2015   Angio-edema 07/12/2014   Hypothyroidism, postsurgical 04/10/2013   Ovarian cyst 04/10/2013   Urinary leakage 04/10/2013   Family history of breast cancer in mother 03/28/2013   Family history of diabetes mellitus 03/28/2013   H/O hysterectomy for benign disease 03/28/2013   History of thyroid cancer 03/28/2013   Hypothyroidism 03/28/2013   Obesity 03/28/2013   Bright red rectal bleeding 03/14/2013     Current Outpatient Medications on File Prior to Visit  Medication Sig Dispense Refill   Calcium Carb-Cholecalciferol (CALCIUM+D3 PO) Take 1 tablet by mouth daily.     estradiol (VIVELLE-DOT) 0.025 MG/24HR Place 1 patch onto the skin 2 (two) times a week.   0   levothyroxine (SYNTHROID, LEVOTHROID) 125 MCG tablet Take 125 mcg by mouth daily before breakfast.     meloxicam (MOBIC) 15 MG tablet meloxicam 15 mg tablet  Take 1 tablet every day by oral route.     Multiple Vitamins-Calcium (ONE-A-DAY WOMENS FORMULA PO) Take 1 tablet by mouth daily.     No current facility-administered medications on file prior to visit.    No Known Allergies  Social History   Socioeconomic History   Marital status: Married    Spouse name: Not on file  Number of children: Not on file   Years of education: Not on file   Highest education level: Not on file  Occupational History   Occupation: Health Care  Tobacco Use   Smoking status: Never   Smokeless tobacco: Never  Vaping Use   Vaping Use: Never used  Substance and Sexual Activity   Alcohol use: No   Drug use: No   Sexual activity: Yes  Other Topics Concern   Not on file  Social History Narrative   Not on file   Social Determinants of Health   Financial Resource Strain: Not on file  Food Insecurity: Not on file  Transportation Needs: Not on file  Physical Activity: Not on file   Stress: Not on file  Social Connections: Not on file  Intimate Partner Violence: Not on file    Family History  Problem Relation Age of Onset   Breast cancer Mother 62       radiation, tamoxifen for 2 years   Uterine cancer Mother 64   Stroke Maternal Grandmother    Colon cancer Neg Hx    Esophageal cancer Neg Hx    Rectal cancer Neg Hx    Stomach cancer Neg Hx    Colon polyps Neg Hx     Past Surgical History:  Procedure Laterality Date   ABDOMINAL HYSTERECTOMY     MYOMECTOMY     SHOULDER SURGERY     THYROIDECTOMY      ROS: Review of Systems Negative except as stated above  PHYSICAL EXAM: BP 110/65   Pulse 82   Temp 98.1 F (36.7 C) (Oral)   Ht '5\' 5"'$  (1.651 m)   Wt 222 lb 6.4 oz (100.9 kg)   SpO2 97%   BMI 37.01 kg/m   Wt Readings from Last 3 Encounters:  11/26/21 222 lb 6.4 oz (100.9 kg)  07/25/18 220 lb 11.2 oz (100.1 kg)  02/09/18 208 lb (94.3 kg)    Physical Exam  General appearance - alert, well appearing, middle-age obese female and in no distress Mental status - normal mood, behavior, speech, dress, motor activity, and thought processes Neck - supple, no significant adenopathy Chest - clear to auscultation, no wheezes, rales or rhonchi, symmetric air entry Heart - normal rate, regular rhythm, normal S1, S2, no murmurs, rubs, clicks or gallops Extremities - peripheral pulses normal, no pedal edema, no clubbing or cyanosis Skin -right middle finger: Patient has less than 1 cm small cystic type lesion on the dorsal surface of the finger just beyond the posterior edge of the nailbed.  It is not tender to touch.  No drainage appreciated at this time.      Latest Ref Rng & Units 06/26/2018    1:41 PM  CMP  Glucose 70 - 99 mg/dL 90   BUN 6 - 20 mg/dL 9   Creatinine 0.44 - 1.00 mg/dL 0.77   Sodium 135 - 145 mmol/L 139   Potassium 3.5 - 5.1 mmol/L 4.1   Chloride 98 - 111 mmol/L 107   CO2 22 - 32 mmol/L 28   Calcium 8.9 - 10.3 mg/dL 9.2    Lipid  Panel  No results found for: "CHOL", "TRIG", "HDL", "CHOLHDL", "VLDL", "LDLCALC", "LDLDIRECT"  CBC    Component Value Date/Time   WBC 7.7 06/26/2018 1341   RBC 5.19 (H) 06/26/2018 1341   HGB 12.2 06/26/2018 1341   HCT 41.1 06/26/2018 1341   PLT 262 06/26/2018 1341   MCV 79.2 (L) 06/26/2018 1341  MCH 23.5 (L) 06/26/2018 1341   MCHC 29.7 (L) 06/26/2018 1341   RDW 14.9 06/26/2018 1341    ASSESSMENT AND PLAN:  1. Establishing care with new doctor, encounter for   2. Obesity (BMI 35.0-39.9 without comorbidity) Discussed options for medical weight management.  Patient indicates that she is given a serious effort in trying to lose weight with dietary changes and regular exercise without much success.  Discussed trying her with [facility].  Went over with her how the medication works and possible side effects of the medicine.  Patient is a bit afraid to try [facility] given some of the negative things she has heard about it on the news media. Discussed trying her with phentermine again.  Advised that the medication can sometimes cause a little elevation in blood pressure palpitations.  We also discussed surgical weight management.  She prefers to give the phentermine another try.  Encouraged her to continue healthy eating habits and regular exercise.  Follow-up with [place] again in about 6 weeks. - phentermine 30 MG capsule; Take 1 capsule (30 mg total) by mouth every morning.  Dispense: 30 capsule; Refill: 1  3. Hypothyroidism, postsurgical Continue Synthroid.  4. Epidermoid cyst of finger Recommend use of triple antibiotic ointment and warm soaks.    Patient was given the opportunity to ask questions.  Patient verbalized understanding of the plan and was able to repeat key elements of the plan.   This documentation was completed using Radio producer.  Any transcriptional errors are unintentional.  No orders of the defined types were placed in this  encounter.    Requested Prescriptions    No prescriptions requested or ordered in this encounter    No follow-ups on file.  Karle Plumber, MD, FACP

## 2022-01-17 ENCOUNTER — Ambulatory Visit: Payer: BC Managed Care – PPO | Attending: Internal Medicine | Admitting: Internal Medicine

## 2022-01-17 VITALS — BP 131/82 | HR 83 | Temp 98.2°F | Ht 65.0 in | Wt 215.6 lb

## 2022-01-17 DIAGNOSIS — E890 Postprocedural hypothyroidism: Secondary | ICD-10-CM | POA: Diagnosis not present

## 2022-01-17 DIAGNOSIS — R030 Elevated blood-pressure reading, without diagnosis of hypertension: Secondary | ICD-10-CM

## 2022-01-17 DIAGNOSIS — E669 Obesity, unspecified: Secondary | ICD-10-CM

## 2022-01-17 NOTE — Progress Notes (Signed)
Patient ID: Anne Krause, female    DOB: August 05, 1965  MRN: [id number]  CC: Obesity follow-up  Subjective: Anne Krause is a 56 y.o. female who presents for f/u obesity Her concerns today include:  History of hypothyroidism, obesity  Obesity:  started on Phentermine on last vsiit to help with wgh management Had restlessness for first 10 days but then subsided.  However she is still having other side effects from the medication including dry mouth, palpitations and elevated blood pressure.  Blood pressure was 143/82 2 to 3 weeks ago while at work.  Down 7 lbs since last visit.   Still on Wgh Watchers and walking 5-6 days a wk. Goes to gym 3x/wk Still concern about trying Wegovy because of possible S.E  HM: She plans to get flu shot through her job.  Patient Active Problem List   Diagnosis Date Noted   Family history of uterine cancer    Symptomatic menopausal or female climacteric states 08/04/2016   Thyroid cancer ([place]) 05/18/2015   Angio-edema 07/12/2014   Hypothyroidism, postsurgical 04/10/2013   Ovarian cyst 04/10/2013   Urinary leakage 04/10/2013   Family history of breast cancer in mother 03/28/2013   Family history of diabetes mellitus 03/28/2013   H/O hysterectomy for benign disease 03/28/2013   History of thyroid cancer 03/28/2013   Hypothyroidism 03/28/2013   Obesity 03/28/2013   Bright red rectal bleeding 03/14/2013     Current Outpatient Medications on File Prior to Visit  Medication Sig Dispense Refill   Calcium Carb-Cholecalciferol (CALCIUM+D3 PO) Take 1 tablet by mouth daily.     estradiol (VIVELLE-DOT) 0.025 MG/24HR Place 1 patch onto the skin 2 (two) times a week.   0   levothyroxine (SYNTHROID, LEVOTHROID) 125 MCG tablet Take 125 mcg by mouth daily before breakfast.     Multiple Vitamins-Calcium (ONE-A-DAY WOMENS FORMULA PO) Take 1 tablet by mouth daily.     phentermine 30 MG capsule Take 1 capsule (30 mg total) by mouth every morning. 30 capsule 1    meloxicam (MOBIC) 15 MG tablet meloxicam 15 mg tablet  Take 1 tablet every day by oral route.     No current facility-administered medications on file prior to visit.    No Known Allergies  Social History   Socioeconomic History   Marital status: Married    Spouse name: Not on file   Number of children: Not on file   Years of education: Not on file   Highest education level: Not on file  Occupational History   Occupation: Health Care  Tobacco Use   Smoking status: Never   Smokeless tobacco: Never  Vaping Use   Vaping Use: Never used  Substance and Sexual Activity   Alcohol use: No   Drug use: No   Sexual activity: Yes  Other Topics Concern   Not on file  Social History Narrative   Not on file   Social Determinants of Health   Financial Resource Strain: Not on file  Food Insecurity: Not on file  Transportation Needs: Not on file  Physical Activity: Not on file  Stress: Not on file  Social Connections: Not on file  Intimate Partner Violence: Not on file    Family History  Problem Relation Age of Onset   Breast cancer Mother 83       radiation, tamoxifen for 2 years   Uterine cancer Mother 64   Stroke Maternal Grandmother    Colon cancer Neg Hx    Esophageal cancer Neg Hx  Rectal cancer Neg Hx    Stomach cancer Neg Hx    Colon polyps Neg Hx     Past Surgical History:  Procedure Laterality Date   ABDOMINAL HYSTERECTOMY     MYOMECTOMY     SHOULDER SURGERY     THYROIDECTOMY      ROS: Review of Systems Negative except as stated above  PHYSICAL EXAM: BP 131/82   Pulse 83   Temp 98.2 F (36.8 C) (Oral)   Ht '5\' 5"'$  (1.651 m)   Wt 215 lb 9.6 oz (97.8 kg)   SpO2 100%   BMI 35.88 kg/m   Wt Readings from Last 3 Encounters:  01/17/22 215 lb 9.6 oz (97.8 kg)  11/26/21 222 lb 6.4 oz (100.9 kg)  07/25/18 220 lb 11.2 oz (100.1 kg)    Physical Exam  General appearance - alert, well appearing, middle-age female and in no distress Mental status -  normal mood, behavior, speech, dress, motor activity, and thought processes Chest - clear to auscultation, no wheezes, rales or rhonchi, symmetric air entry Heart - normal rate, regular rhythm, normal S1, S2, no murmurs, rubs, clicks or gallops      Latest Ref Rng & Units 06/26/2018    1:41 PM  CMP  Glucose 70 - 99 mg/dL 90   BUN 6 - 20 mg/dL 9   Creatinine 0.44 - 1.00 mg/dL 0.77   Sodium 135 - 145 mmol/L 139   Potassium 3.5 - 5.1 mmol/L 4.1   Chloride 98 - 111 mmol/L 107   CO2 22 - 32 mmol/L 28   Calcium 8.9 - 10.3 mg/dL 9.2    CBC    Component Value Date/Time   WBC 7.7 06/26/2018 1341   RBC 5.19 (H) 06/26/2018 1341   HGB 12.2 06/26/2018 1341   HCT 41.1 06/26/2018 1341   PLT 262 06/26/2018 1341   MCV 79.2 (L) 06/26/2018 1341   MCH 23.5 (L) 06/26/2018 1341   MCHC 29.7 (L) 06/26/2018 1341   RDW 14.9 06/26/2018 1341    ASSESSMENT AND PLAN:  1. Obesity (BMI 35.0-39.9 without comorbidity) -I recommend stop phentermine given the side effects that she is having. Discussed with Mancel Parsons is an option but would like for her to find out from her endocrinologist with the it would be okay to use given that she has history of thyroid cancer.  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  However patient is reluctant to use Wegovy.  We discussed referring her to medical weight management and she is agreeable to that. - Amb Ref to Medical Weight Management  2. Hypothyroidism, postsurgical Given the palpitations that she has been having, we will check TSH to make sure that her level is not too low.  However I think the palpitations were due to phentermine - TSH  3. Elevated blood pressure reading without diagnosis of hypertension Likely due to phentermine.  However DASH diet discussed and encouraged.   Patient was given the opportunity to ask questions.  Patient verbalized understanding of the plan and was able to repeat key elements of the plan.   This documentation was completed  using Radio producer.  Any transcriptional errors are unintentional.  No orders of the defined types were placed in this encounter.    Requested Prescriptions    No prescriptions requested or ordered in this encounter    No follow-ups on file.  Karle Plumber, MD, FACP

## 2022-01-17 NOTE — Patient Instructions (Addendum)
Stop Phentermine.  Ask your endocrinologist if it is safe for you to take [facility] given your history of thyroid cancer.

## 2022-01-18 LAB — TSH: TSH: 0.479 u[IU]/mL (ref 0.450–4.500)

## 2022-02-22 ENCOUNTER — Encounter (INDEPENDENT_AMBULATORY_CARE_PROVIDER_SITE_OTHER): Payer: Self-pay

## 2023-05-17 ENCOUNTER — Other Ambulatory Visit: Payer: Self-pay | Admitting: Family Medicine

## 2023-05-17 DIAGNOSIS — M25552 Pain in left hip: Secondary | ICD-10-CM
